# Patient Record
Sex: Male | Born: 1985
Health system: Southern US, Community
[De-identification: ages and names within clinical notes are randomized; demographics above are authoritative.]

## PROBLEM LIST (undated history)

## (undated) DIAGNOSIS — I10 Essential (primary) hypertension: Secondary | ICD-10-CM

---
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
## 2003-11-25 ENCOUNTER — Emergency Department (HOSPITAL_COMMUNITY): Admission: EM | Admit: 2003-11-25 | Discharge: 2003-11-25 | Payer: Self-pay | Admitting: Emergency Medicine

## 2004-04-19 ENCOUNTER — Emergency Department (HOSPITAL_COMMUNITY): Admission: EM | Admit: 2004-04-19 | Discharge: 2004-04-19 | Payer: Self-pay | Admitting: Family Medicine

## 2004-04-25 ENCOUNTER — Emergency Department (HOSPITAL_COMMUNITY): Admission: EM | Admit: 2004-04-25 | Discharge: 2004-04-25 | Payer: Self-pay | Admitting: Family Medicine

## 2004-04-29 ENCOUNTER — Emergency Department (HOSPITAL_COMMUNITY): Admission: EM | Admit: 2004-04-29 | Discharge: 2004-04-29 | Payer: Self-pay | Admitting: Family Medicine

## 2004-11-08 ENCOUNTER — Emergency Department (HOSPITAL_COMMUNITY): Admission: EM | Admit: 2004-11-08 | Discharge: 2004-11-08 | Payer: Self-pay | Admitting: Emergency Medicine

## 2005-02-12 ENCOUNTER — Emergency Department (HOSPITAL_COMMUNITY): Admission: EM | Admit: 2005-02-12 | Discharge: 2005-02-12 | Payer: Self-pay | Admitting: Emergency Medicine

## 2005-05-07 ENCOUNTER — Emergency Department (HOSPITAL_COMMUNITY): Admission: EM | Admit: 2005-05-07 | Discharge: 2005-05-07 | Payer: Self-pay | Admitting: Family Medicine

## 2005-06-11 ENCOUNTER — Emergency Department (HOSPITAL_COMMUNITY): Admission: EM | Admit: 2005-06-11 | Discharge: 2005-06-12 | Payer: Self-pay | Admitting: Emergency Medicine

## 2005-08-12 ENCOUNTER — Emergency Department (HOSPITAL_COMMUNITY): Admission: EM | Admit: 2005-08-12 | Discharge: 2005-08-12 | Payer: Self-pay | Admitting: Family Medicine

## 2005-11-04 ENCOUNTER — Emergency Department (HOSPITAL_COMMUNITY): Admission: EM | Admit: 2005-11-04 | Discharge: 2005-11-04 | Payer: Self-pay | Admitting: Family Medicine

## 2005-11-08 ENCOUNTER — Emergency Department (HOSPITAL_COMMUNITY): Admission: EM | Admit: 2005-11-08 | Discharge: 2005-11-08 | Payer: Self-pay | Admitting: Emergency Medicine

## 2006-02-05 ENCOUNTER — Emergency Department (HOSPITAL_COMMUNITY): Admission: EM | Admit: 2006-02-05 | Discharge: 2006-02-05 | Payer: Self-pay | Admitting: Emergency Medicine

## 2006-02-06 ENCOUNTER — Emergency Department (HOSPITAL_COMMUNITY): Admission: EM | Admit: 2006-02-06 | Discharge: 2006-02-06 | Payer: Self-pay | Admitting: Emergency Medicine

## 2007-10-13 ENCOUNTER — Emergency Department (HOSPITAL_COMMUNITY): Admission: EM | Admit: 2007-10-13 | Discharge: 2007-10-13 | Payer: Self-pay | Admitting: *Deleted

## 2008-07-29 ENCOUNTER — Emergency Department (HOSPITAL_COMMUNITY): Admission: EM | Admit: 2008-07-29 | Discharge: 2008-07-29 | Payer: Self-pay | Admitting: Emergency Medicine

## 2009-06-17 ENCOUNTER — Emergency Department (HOSPITAL_COMMUNITY): Admission: EM | Admit: 2009-06-17 | Discharge: 2009-06-17 | Payer: Self-pay | Admitting: Family Medicine

## 2009-12-28 ENCOUNTER — Emergency Department (HOSPITAL_COMMUNITY): Admission: EM | Admit: 2009-12-28 | Discharge: 2009-12-28 | Payer: Self-pay | Admitting: Emergency Medicine

## 2010-01-26 ENCOUNTER — Emergency Department (HOSPITAL_COMMUNITY): Admission: EM | Admit: 2010-01-26 | Discharge: 2010-01-26 | Payer: Self-pay | Admitting: Emergency Medicine

## 2010-05-13 ENCOUNTER — Emergency Department (HOSPITAL_COMMUNITY): Admission: EM | Admit: 2010-05-13 | Discharge: 2010-05-13 | Payer: Self-pay | Admitting: Family Medicine

## 2010-06-24 ENCOUNTER — Emergency Department (HOSPITAL_COMMUNITY)
Admission: EM | Admit: 2010-06-24 | Discharge: 2010-06-24 | Payer: Self-pay | Source: Home / Self Care | Admitting: Emergency Medicine

## 2010-07-08 ENCOUNTER — Emergency Department (HOSPITAL_COMMUNITY): Admission: EM | Admit: 2010-07-08 | Discharge: 2010-07-08 | Payer: Self-pay | Admitting: Emergency Medicine

## 2010-11-10 ENCOUNTER — Emergency Department (HOSPITAL_COMMUNITY)
Admission: EM | Admit: 2010-11-10 | Discharge: 2010-11-10 | Payer: Self-pay | Source: Home / Self Care | Admitting: Family Medicine

## 2011-01-23 LAB — STREP A DNA PROBE: Group A Strep Probe: NEGATIVE

## 2011-07-23 LAB — INFLUENZA A+B VIRUS AG-DIRECT(RAPID): Inflenza A Ag: POSITIVE — AB

## 2011-10-28 ENCOUNTER — Encounter (HOSPITAL_COMMUNITY): Payer: Self-pay

## 2011-10-28 ENCOUNTER — Emergency Department (INDEPENDENT_AMBULATORY_CARE_PROVIDER_SITE_OTHER)
Admission: EM | Admit: 2011-10-28 | Discharge: 2011-10-28 | Disposition: A | Discharge status: Home / Self Care | Payer: Self-pay | Source: Home / Self Care | Attending: Emergency Medicine | Admitting: Emergency Medicine

## 2011-10-28 DIAGNOSIS — R0981 Nasal congestion: Secondary | ICD-10-CM

## 2011-10-28 DIAGNOSIS — J3489 Other specified disorders of nose and nasal sinuses: Secondary | ICD-10-CM

## 2011-10-28 DIAGNOSIS — J45909 Unspecified asthma, uncomplicated: Secondary | ICD-10-CM

## 2011-10-28 MED ORDER — ALBUTEROL SULFATE HFA 108 (90 BASE) MCG/ACT IN AERS: 1.0000 | INHALATION_SPRAY | Freq: Four times a day (QID) | RESPIRATORY_TRACT | Status: DC | PRN

## 2011-10-28 MED ORDER — FEXOFENADINE-PSEUDOEPHED ER 60-120 MG PO TB12: 1.0000 | ORAL_TABLET | Freq: Two times a day (BID) | ORAL | Status: AC

## 2011-10-28 NOTE — ED Notes (Signed)
C/o headaches, tan nasal secretions, sinus congestion since Sunday.  Denies cough or fever.

## 2011-10-28 NOTE — ED Provider Notes (Signed)
History     CSN: [id number]  Arrival date & time 10/28/11  4098   First MD Initiated Contact with Patient 10/28/11 [phone]      Chief Complaint  Patient presents with  . Sinusitis    (Consider location/radiation/quality/duration/timing/severity/associated sxs/prior treatment) HPI Comments: SINUS CONGESTION AND COLORED PHLEGM, MILD COUGH, SOME WHEEZING FOR 4 DAYS  Patient is a 26 y.o. male presenting with sinusitis. The history is provided by the patient.  Sinusitis  This is a new problem. The current episode started more than 2 days ago. The problem has not changed since onset.There has been no fever. The pain is moderate. Associated symptoms include chills, ear pain, sinus pressure, sore throat, cough and shortness of breath. Pertinent negatives include no swollen glands. He has tried aspirin for the symptoms. The treatment provided no relief.    Past Medical History  Diagnosis Date  . Asthma     History reviewed. No pertinent past surgical history.  No family history on file.  History  Substance Use Topics  . Smoking status: Current Everyday Smoker -- 1.0 packs/day  . Smokeless tobacco: Not on file  . Alcohol Use: No      Review of Systems  Constitutional: Positive for chills. Negative for fever.  HENT: Positive for ear pain, sore throat and sinus pressure.   Respiratory: Positive for cough, shortness of breath and wheezing.     Allergies  Review of patient's allergies indicates no known allergies.  Home Medications   Current Outpatient Rx  Name Route Sig Dispense Refill  . ALBUTEROL IN Inhalation Inhale into the lungs as needed.    . ALBUTEROL SULFATE HFA 108 (90 BASE) MCG/ACT IN AERS Inhalation Inhale 1-2 puffs into the lungs every 6 (six) hours as needed for wheezing. 1 Inhaler 0  . FEXOFENADINE-PSEUDOEPHED ER 60-120 MG PO TB12 Oral Take 1 tablet by mouth every 12 (twelve) hours. 30 tablet 0    BP 126/92  Pulse 70  Temp(Src) 98.2 F (36.8 C) (Oral)   Resp 18  SpO2 99%  Physical Exam  Nursing note and vitals reviewed. Constitutional: He appears well-nourished. No distress.  HENT:  Head: Normocephalic.  Right Ear: Tympanic membrane normal.  Left Ear: Tympanic membrane normal.  Nose: Nose normal.  Mouth/Throat: Uvula is midline and mucous membranes are normal. Posterior oropharyngeal erythema present.  Eyes: Conjunctivae are normal.  Neck: Trachea normal and normal range of motion.  Cardiovascular: Normal rate.   Pulmonary/Chest: Effort normal and breath sounds normal. No respiratory distress. He has no decreased breath sounds. He has no wheezes. He has no rhonchi. He has no rales.  Lymphadenopathy:    He has no cervical adenopathy.  Neurological: He is alert. He has normal strength. No cranial nerve deficit or sensory deficit.  Skin: Skin is warm. No rash noted.    ED Course  Procedures (including critical care time)  Labs Reviewed - No data to display No results found.   1. Sinus congestion   2. Asthma       MDM  URI with mild RAD        Jimmie Molly, MD 10/28/11 1111

## 2014-08-10 ENCOUNTER — Emergency Department (HOSPITAL_COMMUNITY)
Admission: EM | Admit: 2014-08-10 | Discharge: 2014-08-10 | Disposition: A | Discharge status: Home / Self Care | Payer: BC Managed Care – PPO | Source: Home / Self Care | Attending: Family Medicine | Admitting: Family Medicine

## 2014-08-10 ENCOUNTER — Encounter (HOSPITAL_COMMUNITY): Payer: Self-pay | Admitting: Emergency Medicine

## 2014-08-10 DIAGNOSIS — J45901 Unspecified asthma with (acute) exacerbation: Secondary | ICD-10-CM

## 2014-08-10 MED ORDER — PREDNISONE 50 MG PO TABS: 50.0000 mg | ORAL_TABLET | Freq: Every day | ORAL | Status: DC

## 2014-08-10 MED ORDER — ALBUTEROL SULFATE HFA 108 (90 BASE) MCG/ACT IN AERS: 2.0000 | INHALATION_SPRAY | Freq: Four times a day (QID) | RESPIRATORY_TRACT | Status: DC | PRN

## 2014-08-10 MED ORDER — GUAIFENESIN-CODEINE 100-10 MG/5ML PO SOLN: 5.0000 mL | Freq: Every evening | ORAL | Status: DC | PRN

## 2014-08-10 MED ORDER — IPRATROPIUM-ALBUTEROL 0.5-2.5 (3) MG/3ML IN SOLN
RESPIRATORY_TRACT | Status: AC
  Filled 2014-08-10: qty 3

## 2014-08-10 MED ORDER — IPRATROPIUM-ALBUTEROL 0.5-2.5 (3) MG/3ML IN SOLN
3.0000 mL | Freq: Once | RESPIRATORY_TRACT | Status: AC
  Administered 2014-08-10: 3 mL via RESPIRATORY_TRACT

## 2014-08-10 NOTE — ED Provider Notes (Signed)
Derek Lawson is a 28 y.o. male who presents to Urgent Care today for shortness of breath and wheezing. Patient notes a one-week history of wheezing and shortness of breath. He's used his albuterol inhaler and is running out. He notes a productive cough. No chest pain or palpitations. He is a former smoker. His symptoms are consistent with previous episodes of asthma.   Past Medical History  Diagnosis Date  . Asthma    History  Substance Use Topics  . Smoking status: Former Smoker    Types: Cigarettes    Quit date: 08/10/2013  . Smokeless tobacco: Not on file  . Alcohol Use: No   ROS as above Medications: No current facility-administered medications for this encounter.   Current Outpatient Prescriptions  Medication Sig Dispense Refill  . loratadine (CLARITIN) 10 MG tablet Take 10 mg by mouth daily.      . [DISCONTINUED] ALBUTEROL IN Inhale 2 puffs into the lungs 4 (four) times daily as needed.       Marland Kitchen. albuterol (PROVENTIL HFA;VENTOLIN HFA) 108 (90 BASE) MCG/ACT inhaler Inhale 2 puffs into the lungs every 6 (six) hours as needed for wheezing or shortness of breath.  1 Inhaler  2  . guaiFENesin-codeine 100-10 MG/5ML syrup Take 5 mLs by mouth at bedtime as needed for cough.  120 mL  0  . predniSONE (DELTASONE) 50 MG tablet Take 1 tablet (50 mg total) by mouth daily.  5 tablet  0    Exam:  BP 136/89  Pulse 86  Temp(Src) 98 F (36.7 C) (Oral)  Resp 24  SpO2 96% Gen: Well NAD HEENT: EOMI,  MMM Lungs: Normal work of breathing. Coarse breath sounds and prolonged expiratory phase with wheezing bilateral Heart: RRR no MRG Abd: NABS, Soft. Nondistended, Nontender Exts: Brisk capillary refill, warm and well perfused.   Patient was given a 2.5/0.5 mg DuoNeb nebulizer treatment, and felt better his lung exam became normal.   No results found for this or any previous visit (from the past 24 hour(s)). No results found.  Assessment and Plan: 28 y.o. male with asthma exacerbation.  Treat with prednisone albuterol and codeine containing cough medication.  Discussed warning signs or symptoms. Please see discharge instructions. Patient expresses understanding.     Rodolph BongEvan S Dewitt Judice, MD 08/10/14 [phone]

## 2014-08-10 NOTE — ED Notes (Signed)
He had a cold last week.  Started wheezing off and on last week and used his Albuteral inhaler QID-its getting low.  Cough is prod. Of clear sputum.  No chest pain or fever.  C/o throat itching but not sore.

## 2014-08-10 NOTE — Discharge Instructions (Signed)
Thank you for coming in today. Call or go to the emergency room if you get worse, have trouble breathing, have chest pains, or palpitations.  Follow up with a primary doctor.   PRIMARY CARE Merchant navy officerDOCTORS [facility] [address]  [place], [place] [phone]  Fax [phone]  Nature conservation [facility] at [facility] [address]  [place], [place] Ph [phone]  Fax [phone]  Nature conservation officer[facility] at [place] / [place] [id number]  [place], [place] Ph [phone]  Fax [phone]  [facility] [address]  [place], [place] Ph [phone]  Fax [phone]  [facility] [address] [place]  [place], [place] Ph [phone]  Fax [phone]  [facility] [address] [place]  [place], [place] Ph [phone]  Fax [phone]   [facility] @ [place] [address] [place] [address] Phone: [phone]   [facility] [address] [place] [address] Phone: [phone]   [facility] @ [place] [address] [place] [address] Phone: [phone]   [facility] @ [place] [address] [place] [place] [address] Phone: [phone]   [facility] @ [place] [address] [place] [address] Phone: [phone] Fax: [phone]   [facility] @ [place] [address] [place] [address] Phone: [phone]   Dr. Maryelizabeth RowanElizabeth Dewey [address] [place] [address] [phone]   Asthma Asthma is a recurring condition in which the airways tighten and narrow. Asthma can make it difficult to breathe. It can cause coughing, wheezing, and shortness of breath. Asthma episodes, also called asthma  attacks, range from minor to life-threatening. Asthma cannot be cured, but medicines and lifestyle changes can help control it. CAUSES Asthma is believed to be caused by inherited (genetic) and environmental factors, but its exact cause is unknown. Asthma may be triggered by allergens, lung infections, or irritants in the air. Asthma triggers are different for each person. Common triggers include:   Animal dander.  Dust mites.  Cockroaches.  Pollen from trees or grass.  Mold.  Smoke.  Air pollutants such as dust, household cleaners, hair sprays, aerosol sprays, paint fumes, strong chemicals, or strong odors.  Cold air, weather changes, and winds (which increase molds and pollens in the air).  Strong emotional expressions such as crying or laughing hard.  Stress.  Certain medicines (such as aspirin) or types of drugs (such as beta-blockers).  Sulfites in foods and drinks. Foods and drinks that may contain sulfites include dried fruit, potato chips, and sparkling grape juice.  Infections or inflammatory conditions such as the flu, a cold, or an inflammation of the nasal membranes (rhinitis).  Gastroesophageal reflux disease (GERD).  Exercise or strenuous activity. SYMPTOMS Symptoms may occur immediately after asthma is triggered or many hours later. Symptoms include:  Wheezing.  Excessive nighttime or early morning coughing.  Frequent or severe coughing with a common cold.  Chest tightness.  Shortness of breath. DIAGNOSIS  The diagnosis of asthma is made by a review of your medical history and a physical exam. Tests may also be performed. These may include:  Lung function studies. These tests show how much air you breathe in and out.  Allergy tests.  Imaging tests such as X-rays. TREATMENT  Asthma cannot be cured, but it can usually be controlled. Treatment involves identifying and avoiding your asthma triggers. It also involves medicines. There are 2 classes of  medicine used for asthma treatment:   Controller medicines. These prevent asthma symptoms from occurring. They are usually taken every day.  Reliever or rescue medicines. These quickly relieve asthma symptoms. They are used as needed and provide short-term relief. Your health care provider will help you create an asthma action plan. An asthma action plan is a written plan for managing and treating your asthma attacks. It includes a list of your asthma triggers and how they may be avoided. It also includes information on when medicines should be taken and when their dosage should be changed. An action plan may also involve the use of a device called a peak flow meter. A peak flow meter measures how well the lungs are working. It helps you monitor your condition. HOME CARE INSTRUCTIONS   Take medicines only as directed by your health care provider. Speak with your health care provider if you have questions about how or when to take the medicines.  Use a peak flow meter as directed by your health care provider. Record and keep track of readings.  Understand and use the action plan to help minimize or stop an asthma attack without needing to seek medical care.  Control your home environment in the following ways to help prevent asthma attacks:  Do not smoke. Avoid being exposed to secondhand smoke.  Change your heating and air conditioning filter regularly.  Limit your use of fireplaces and wood stoves.  Get rid of pests (such as roaches and mice) and their droppings.  Throw away plants if you see mold on them.  Clean your floors and dust regularly. Use unscented cleaning products.  Try to have someone else vacuum for you regularly. Stay out of rooms while they are being vacuumed and for a short while afterward. If you vacuum, use a dust mask from a hardware store, a double-layered or microfilter vacuum cleaner bag, or a vacuum cleaner with a HEPA filter.  Replace carpet with wood, tile, or  vinyl flooring. Carpet can trap dander and dust.  Use allergy-proof pillows, mattress covers, and box spring covers.  Wash bed sheets and blankets every week in hot water and dry them in a dryer.  Use blankets that are made of polyester or cotton.  Clean bathrooms and kitchens with bleach. If possible, have someone repaint the walls in these rooms with mold-resistant paint. Keep out of the rooms that are being cleaned and painted.  Wash hands frequently. SEEK MEDICAL CARE IF:   You have wheezing, shortness of breath, or a cough even if taking medicine to prevent attacks.  The colored mucus you cough up (sputum) is thicker than usual.  Your sputum changes from clear or white to yellow, green, gray, or bloody.  You have any problems that may be related to the medicines you are taking (such as a rash, itching, swelling, or trouble breathing).  You are using a reliever medicine more than 2-3 times per week.  Your peak flow is still at 50-79% of your personal best after following your action plan for 1 hour.  You have a fever. SEEK IMMEDIATE MEDICAL CARE IF:   You seem to be getting worse and are unresponsive to treatment during an asthma attack.  You are short of breath even at rest.  You  get short of breath when doing very little physical activity.  You have difficulty eating, drinking, or talking due to asthma symptoms.  You develop chest pain.  You develop a fast heartbeat.  You have a bluish color to your lips or fingernails.  You are light-headed, dizzy, or faint.  Your peak flow is less than 50% of your personal best. MAKE SURE YOU:   Understand these instructions.  Will watch your condition.  Will get help right away if you are not doing well or get worse. Document Released: 10/04/2005 Document Revised: 02/18/2014 Document Reviewed: 05/03/2013 [facility] Patient Information 2015 [place], [place]. This information is not intended to replace advice given to you by  your health care provider. Make sure you discuss any questions you have with your health care provider.

## 2015-04-05 ENCOUNTER — Emergency Department (HOSPITAL_COMMUNITY)
Admission: EM | Admit: 2015-04-05 | Discharge: 2015-04-05 | Disposition: A | Discharge status: Home / Self Care | Payer: Self-pay | Attending: Emergency Medicine | Admitting: Emergency Medicine

## 2015-04-05 ENCOUNTER — Encounter (HOSPITAL_COMMUNITY): Payer: Self-pay | Admitting: *Deleted

## 2015-04-05 ENCOUNTER — Emergency Department (HOSPITAL_COMMUNITY): Payer: Self-pay

## 2015-04-05 DIAGNOSIS — J029 Acute pharyngitis, unspecified: Secondary | ICD-10-CM | POA: Insufficient documentation

## 2015-04-05 DIAGNOSIS — Z79899 Other long term (current) drug therapy: Secondary | ICD-10-CM | POA: Insufficient documentation

## 2015-04-05 DIAGNOSIS — R059 Cough, unspecified: Secondary | ICD-10-CM

## 2015-04-05 DIAGNOSIS — R05 Cough: Secondary | ICD-10-CM | POA: Insufficient documentation

## 2015-04-05 DIAGNOSIS — Z87891 Personal history of nicotine dependence: Secondary | ICD-10-CM | POA: Insufficient documentation

## 2015-04-05 DIAGNOSIS — J45909 Unspecified asthma, uncomplicated: Secondary | ICD-10-CM | POA: Insufficient documentation

## 2015-04-05 LAB — RAPID STREP SCREEN (MED CTR MEBANE ONLY): Streptococcus, Group A Screen (Direct): NEGATIVE

## 2015-04-05 MED ORDER — AMOXICILLIN 500 MG PO CAPS: 500.0000 mg | ORAL_CAPSULE | Freq: Three times a day (TID) | ORAL | Status: DC

## 2015-04-05 MED ORDER — ALBUTEROL SULFATE (2.5 MG/3ML) 0.083% IN NEBU
5.0000 mg | INHALATION_SOLUTION | Freq: Once | RESPIRATORY_TRACT | Status: AC
  Administered 2015-04-05: 5 mg via RESPIRATORY_TRACT
  Filled 2015-04-05: qty 6

## 2015-04-05 NOTE — Discharge Instructions (Signed)
Take the prescribed medication as directed. °Rest and drink plenty of fluids. °Return to the ED for new or worsening symptoms. ° °

## 2015-04-05 NOTE — ED Notes (Signed)
Pt c/o sore throat and congestion x 2 days; pt states that he cannot breathe through his nose; pt c/o throat feeling swollen

## 2015-04-05 NOTE — ED Provider Notes (Signed)
CSN: [id number]     Arrival date & time 04/05/15  9774 History   First MD Initiated Contact with Patient 04/05/15 [phone]     Chief Complaint  Patient presents with  . Sore Throat  . Nasal Congestion     (Consider location/radiation/quality/duration/timing/severity/associated sxs/prior Treatment) The history is provided by the patient and medical records.     This is a 29 y.o. F here with 3 days of URI symptoms.  Specifically patient has had sore throat, productive cough with brown sputum, and nasal congestion.  States it is painful to swallow, no difficulty doing so.  Endorses subjective fever and chills.  No chest pain or SOB.  No abdominal pain, nausea, vomiting, or diarrhea.  No known sick contacts.  Patient does have hx of asthma, uses albuterol inhaler only.  Past Medical History  Diagnosis Date  . Asthma    History reviewed. No pertinent past surgical history. Family History  Problem Relation Age of Onset  . Diabetes Father    History  Substance Use Topics  . Smoking status: Former Smoker    Types: Cigarettes    Quit date: 08/10/2013  . Smokeless tobacco: Not on file  . Alcohol Use: Yes     Comment: socially    Review of Systems  HENT: Positive for congestion and sore throat.   Respiratory: Positive for cough.   All other systems reviewed and are negative.     Allergies  Review of patient's allergies indicates no known allergies.  Home Medications   Prior to Admission medications   Medication Sig Start Date End Date Taking? Authorizing Provider  albuterol (PROVENTIL HFA;VENTOLIN HFA) 108 (90 BASE) MCG/ACT inhaler Inhale 2 puffs into the lungs every 6 (six) hours as needed for wheezing or shortness of breath. 08/10/14  Yes Rodolph Bong, MD  guaiFENesin (MUCINEX) 600 MG 12 hr tablet Take 600 mg by mouth 2 (two) times daily as needed for cough.   Yes Historical Provider, MD  ibuprofen (ADVIL,MOTRIN) 200 MG tablet Take 400 mg by mouth every 6 (six) hours as needed  for moderate pain.   Yes Historical Provider, MD  guaiFENesin-codeine 100-10 MG/5ML syrup Take 5 mLs by mouth at bedtime as needed for cough. Patient not taking: Reported on 04/05/2015 08/10/14   Rodolph Bong, MD  loratadine (CLARITIN) 10 MG tablet Take 10 mg by mouth daily.    Historical Provider, MD  predniSONE (DELTASONE) 50 MG tablet Take 1 tablet (50 mg total) by mouth daily. Patient not taking: Reported on 04/05/2015 08/10/14   Rodolph Bong, MD   BP 142/89 mmHg  Pulse 104  Temp(Src) 97.8 F (36.6 C) (Oral)  Resp 20  SpO2 95%   Physical Exam  Constitutional: He is oriented to person, place, and time. He appears well-developed and well-nourished. No distress.  HENT:  Head: Normocephalic and atraumatic.  Right Ear: Tympanic membrane and ear canal normal.  Left Ear: Tympanic membrane and ear canal normal.  Nose: Mucosal edema present.  Mouth/Throat: Uvula is midline and mucous membranes are normal. No oral lesions. No trismus in the jaw. Posterior oropharyngeal erythema present. No oropharyngeal exudate, posterior oropharyngeal edema or tonsillar abscesses.  Tonsils 2+ bilaterally without exudate; uvula midline without peritonsillar abscess; handling secretions appropriately; no difficulty swallowing or speaking  Eyes: Conjunctivae and EOM are normal. Pupils are equal, round, and reactive to light.  Neck: Normal range of motion. Neck supple.  Cardiovascular: Normal rate, regular rhythm and normal heart sounds.   Pulmonary/Chest: Effort  normal and breath sounds normal. No respiratory distress. He has no wheezes.  Abdominal: Soft. Bowel sounds are normal. There is no tenderness. There is no guarding.  Musculoskeletal: Normal range of motion.  Neurological: He is alert and oriented to person, place, and time.  Skin: Skin is warm. He is not diaphoretic.  Psychiatric: He has a normal mood and affect.  Nursing note and vitals reviewed.   ED Course  Procedures (including critical care  time) Labs Review Labs Reviewed  RAPID STREP SCREEN (NOT AT [facility])    Imaging Review Dg Chest 2 View  04/05/2015   CLINICAL DATA:  Cough.  EXAM: CHEST  2 VIEW  COMPARISON:  October 13, 2007.  FINDINGS: The heart size and mediastinal contours are within normal limits. Both lungs are clear. No pneumothorax or pleural effusion is noted. The visualized skeletal structures are unremarkable.  IMPRESSION: No active cardiopulmonary disease.   Electronically Signed   By: Lupita Raider, M.D.   On: 04/05/2015 07:04     EKG Interpretation None      MDM   Final diagnoses:  Cough  Sore throat   29 y.o. M with URI symptoms x3 days.  Patient afebrile, non-toxic.  Tonsils are enlarged bilaterally without exudates, handling secretions well.  No signs of peritonsillar abscess.  Slight expiratory wheezes noted, no distress.  VSS on RA.  CXR without acute findings.  Lung sounds cleared after albuterol neb.  Rapid strep was obtained however have had difficulties with processing.  Given his tonsillar enlargement will start on abx for strep.  Patient encouraged to rest, drink fluids.  Use albuterol inhaler PRN.  Discussed plan with patient, he/she acknowledged understanding and agreed with plan of care.  Return precautions given for new or worsening symptoms.  Garlon Hatchet, PA-C 04/05/15 1610  Blake Divine, MD 04/05/15 2308

## 2015-04-07 LAB — CULTURE, GROUP A STREP: STREP A CULTURE: NEGATIVE

## 2015-09-08 ENCOUNTER — Encounter (HOSPITAL_COMMUNITY): Payer: Self-pay | Admitting: Emergency Medicine

## 2015-09-08 ENCOUNTER — Emergency Department (HOSPITAL_COMMUNITY)
Admission: EM | Admit: 2015-09-08 | Discharge: 2015-09-08 | Disposition: A | Discharge status: Home / Self Care | Payer: Self-pay | Attending: Physician Assistant | Admitting: Physician Assistant

## 2015-09-08 DIAGNOSIS — Z792 Long term (current) use of antibiotics: Secondary | ICD-10-CM | POA: Insufficient documentation

## 2015-09-08 DIAGNOSIS — J45909 Unspecified asthma, uncomplicated: Secondary | ICD-10-CM | POA: Insufficient documentation

## 2015-09-08 DIAGNOSIS — M546 Pain in thoracic spine: Secondary | ICD-10-CM | POA: Insufficient documentation

## 2015-09-08 DIAGNOSIS — Z87891 Personal history of nicotine dependence: Secondary | ICD-10-CM | POA: Insufficient documentation

## 2015-09-08 DIAGNOSIS — Z79899 Other long term (current) drug therapy: Secondary | ICD-10-CM | POA: Insufficient documentation

## 2015-09-08 LAB — URINALYSIS, ROUTINE W REFLEX MICROSCOPIC
BILIRUBIN URINE: NEGATIVE
GLUCOSE, UA: NEGATIVE mg/dL
HGB URINE DIPSTICK: NEGATIVE
Ketones, ur: NEGATIVE mg/dL
Leukocytes, UA: NEGATIVE
Nitrite: NEGATIVE
PH: 6 (ref 5.0–8.0)
Protein, ur: NEGATIVE mg/dL
SPECIFIC GRAVITY, URINE: 1.028 (ref 1.005–1.030)

## 2015-09-08 MED ORDER — ACETAMINOPHEN 325 MG PO TABS
650.0000 mg | ORAL_TABLET | Freq: Once | ORAL | Status: AC
  Administered 2015-09-08: 650 mg via ORAL
  Filled 2015-09-08: qty 2

## 2015-09-08 MED ORDER — IBUPROFEN 200 MG PO TABS
400.0000 mg | ORAL_TABLET | Freq: Once | ORAL | Status: AC
  Administered 2015-09-08: 400 mg via ORAL
  Filled 2015-09-08: qty 2

## 2015-09-08 MED ORDER — IBUPROFEN 800 MG PO TABS: 800.0000 mg | ORAL_TABLET | Freq: Three times a day (TID) | ORAL | Status: DC

## 2015-09-08 NOTE — ED Notes (Signed)
Patient here with complaints of back pain. Reports that he woke up with pain, denies injury.

## 2015-09-08 NOTE — ED Provider Notes (Signed)
CSN: [id number]     Arrival date & time 09/08/15  1002 History   First MD Initiated Contact with Patient 09/08/15 1022     Chief Complaint  Patient presents with  . Back Pain     (Consider location/radiation/quality/duration/timing/severity/associated sxs/prior Treatment) HPI  Patient presents with left sided back pain that began this morning while doing yardwork Constant, sharp, poking, 8/10 Worse with movement 400 mg ibuprofen PTA Ambulator without difficulty Denies bowel or bladder incontinence, saddle anesthesia, IVDU, trauma, hx of cancer Denies urinary symptoms, abdominal pain, fevers, SOB, numbness, tingling, or weakness.  Past Medical History  Diagnosis Date  . Asthma    History reviewed. No pertinent past surgical history. Family History  Problem Relation Age of Onset  . Diabetes Father    Social History  Substance Use Topics  . Smoking status: Former Smoker    Types: Cigarettes    Quit date: 08/10/2013  . Smokeless tobacco: None  . Alcohol Use: Yes     Comment: socially    Review of Systems  All other systems negative unless otherwise stated in HPI   Allergies  Review of patient's allergies indicates no known allergies.  Home Medications   Prior to Admission medications   Medication Sig Start Date End Date Taking? Authorizing Provider  albuterol (PROVENTIL HFA;VENTOLIN HFA) 108 (90 BASE) MCG/ACT inhaler Inhale 2 puffs into the lungs every 6 (six) hours as needed for wheezing or shortness of breath. 08/10/14   Rodolph BongEvan S Corey, MD  amoxicillin (AMOXIL) 500 MG capsule Take 1 capsule (500 mg total) by mouth 3 (three) times daily. 04/05/15   Garlon HatchetLisa M Sanders, PA-C  guaiFENesin (MUCINEX) 600 MG 12 hr tablet Take 600 mg by mouth 2 (two) times daily as needed for cough.    Historical Provider, MD  guaiFENesin-codeine 100-10 MG/5ML syrup Take 5 mLs by mouth at bedtime as needed for cough. Patient not taking: Reported on 04/05/2015 08/10/14   Rodolph BongEvan S Corey, MD    ibuprofen (ADVIL,MOTRIN) 800 MG tablet Take 1 tablet (800 mg total) by mouth 3 (three) times daily. 09/08/15   Cheri FowlerKayla Misaki Sozio, PA-C  loratadine (CLARITIN) 10 MG tablet Take 10 mg by mouth daily.    Historical Provider, MD  predniSONE (DELTASONE) 50 MG tablet Take 1 tablet (50 mg total) by mouth daily. Patient not taking: Reported on 04/05/2015 08/10/14   Rodolph BongEvan S Corey, MD   BP 148/96 mmHg  Pulse 86  Temp(Src) 98.5 F (36.9 C) (Oral)  Resp 15  SpO2 97% Physical Exam  Constitutional: He is oriented to person, place, and time. He appears well-developed and well-nourished.  HENT:  Head: Atraumatic.  Eyes: Conjunctivae are normal.  Cardiovascular: Normal rate, regular rhythm, normal heart sounds and intact distal pulses.   Pulses:      Posterior tibial pulses are 2+ on the right side, and 2+ on the left side.  Pulmonary/Chest: Effort normal and breath sounds normal.  Abdominal: Soft. Bowel sounds are normal. He exhibits no distension. There is no tenderness. There is no CVA tenderness.  Musculoskeletal:       Back:  No spinous process tenderness.  No step offs.  Neurological: He is alert and oriented to person, place, and time.  No saddle anesthesia.  Strength and sensation intact bilaterally in lower extremities.  Gait normal.   Skin: Skin is warm and dry.  Psychiatric: He has a normal mood and affect. His behavior is normal.    ED Course  Procedures (including critical care time) Labs  Review Labs Reviewed  URINALYSIS, ROUTINE W REFLEX MICROSCOPIC (NOT AT [facility])    Imaging Review No results found. I have personally reviewed and evaluated these images and lab results as part of my medical decision-making.   EKG Interpretation None      MDM   Final diagnoses:  Left-sided thoracic back pain    Patient presents with back pain.  VSS.  No trauma.  No fevers, SOB, urinary symptoms.  No indication for imaging.  No red flags.  No neurological deficits.  Distal pulses intact.  No  gait abnormalities.  Suspect back strain or other mechanical cause.  Doubt cauda equina.  Doubt infectious process.  Doubt AAA.  Discussed return precautions to the ED. Follow up with PCP.     Cheri Fowler, PA-C 09/08/15 1208  Courteney Lyn Mackuen, MD 09/09/15 0700

## 2015-09-08 NOTE — Discharge Instructions (Signed)

## 2016-04-09 ENCOUNTER — Emergency Department (HOSPITAL_COMMUNITY)
Admission: EM | Admit: 2016-04-09 | Discharge: 2016-04-09 | Disposition: A | Discharge status: Home / Self Care | Payer: Self-pay | Attending: Emergency Medicine | Admitting: Emergency Medicine

## 2016-04-09 ENCOUNTER — Encounter (HOSPITAL_COMMUNITY): Payer: Self-pay | Admitting: *Deleted

## 2016-04-09 DIAGNOSIS — Z87891 Personal history of nicotine dependence: Secondary | ICD-10-CM | POA: Insufficient documentation

## 2016-04-09 DIAGNOSIS — J45909 Unspecified asthma, uncomplicated: Secondary | ICD-10-CM | POA: Insufficient documentation

## 2016-04-09 DIAGNOSIS — R030 Elevated blood-pressure reading, without diagnosis of hypertension: Secondary | ICD-10-CM | POA: Insufficient documentation

## 2016-04-09 NOTE — Care Management Note (Signed)
Case Management Note  Patient Details  Name: Derek Lawson MRN: [id number] Date of Birth: 1986/06/26  Subjective/Objective:                  30 y.o. male who presents to the Emergency Department complaining of HTN./ From home alone.  Action/Plan: Follow for disposition needs./ Set up follow-up appointment   Expected Discharge Date:      04/09/16            Expected Discharge Plan:  Home/Self Care  In-House Referral:  NA  Discharge planning Services  CM Consult, Follow-up appt scheduled  Post Acute Care Choice:  NA Choice offered to:  NA  DME Arranged:  N/A DME Agency:  NA  HH Arranged:  NA HH Agency:  NA  Status of Service:  Completed, signed off  If discussed at [place] of Stay Meetings, dates discussed:    Additional Comments: [name] J. Clydene Laming, RN, BSN, [place] [phone] ER CM consulted regarding PCP establishment and insurance enrollment. Pt presented to [facility] ER today with HTN. NCM met with pt at bedside; pt confirms not having access to f/u care with PCP or insurance coverage. Discussed with patient importance and benefits of establishing PCP, and not utilizing the ER for primary care needs. Pt verbalized understanding and is in agreement.  NCM advised that Internal Medicine providers at [facility] are accepting new pts that are uninsured. Pt verbalized understanding and asked to have appointment arranged. NCM set up appointment at [facility] with [name], [place] on 6/28 at 1000.  NCM informed pt they may visit [place] and [place] for Rx needs upon discharge.  NCM provided business card of [facility], [place] [facility] and instructed to call with question or concerns pertaining [facility]/orange card process.  Pt verbalized understanding.  [name], RN 04/09/2016, 10:33 AM

## 2016-04-09 NOTE — ED Notes (Signed)
Pt reports he was seen for a physical for a job & was sent here by an UC for eval for HTN, pt denies blurred vision & HA, pt asymptomatic at this time, A&O x4, BP 144/102

## 2016-04-09 NOTE — Discharge Instructions (Signed)
DASH Eating Plan °DASH stands for "Dietary Approaches to Stop Hypertension." The DASH eating plan is a healthy eating plan that has been shown to reduce high blood pressure (hypertension). Additional health benefits may include reducing the risk of type 2 diabetes mellitus, heart disease, and stroke. The DASH eating plan may also help with weight loss. °WHAT DO I NEED TO KNOW ABOUT THE DASH EATING PLAN? °For the DASH eating plan, you will follow these general guidelines: °· Choose foods with a percent daily value for sodium of less than 5% (as listed on the food label). °· Use salt-free seasonings or herbs instead of table salt or sea salt. °· Check with your health care provider or pharmacist before using salt substitutes. °· Eat lower-sodium products, often labeled as "lower sodium" or "no salt added." °· Eat fresh foods. °· Eat more vegetables, fruits, and low-fat dairy products. °· Choose whole grains. Look for the word "whole" as the first word in the ingredient list. °· Choose fish and skinless chicken or turkey more often than red meat. Limit fish, poultry, and meat to 6 oz (170 g) each day. °· Limit sweets, desserts, sugars, and sugary drinks. °· Choose heart-healthy fats. °· Limit cheese to 1 oz (28 g) per day. °· Eat more home-cooked food and less restaurant, buffet, and fast food. °· Limit fried foods. °· Cook foods using methods other than frying. °· Limit canned vegetables. If you do use them, rinse them well to decrease the sodium. °· When eating at a restaurant, ask that your food be prepared with less salt, or no salt if possible. °WHAT FOODS CAN I EAT? °Seek help from a dietitian for individual calorie needs. °Grains °Whole grain or whole wheat bread. Brown rice. Whole grain or whole wheat pasta. Quinoa, bulgur, and whole grain cereals. Low-sodium cereals. Corn or whole wheat flour tortillas. Whole grain cornbread. Whole grain crackers. Low-sodium crackers. °Vegetables °Fresh or frozen vegetables  (raw, steamed, roasted, or grilled). Low-sodium or reduced-sodium tomato and vegetable juices. Low-sodium or reduced-sodium tomato sauce and paste. Low-sodium or reduced-sodium canned vegetables.  °Fruits °All fresh, canned (in natural juice), or frozen fruits. °Meat and Other Protein Products °Ground beef (85% or leaner), grass-fed beef, or beef trimmed of fat. Skinless chicken or turkey. Ground chicken or turkey. Pork trimmed of fat. All fish and seafood. Eggs. Dried beans, peas, or lentils. Unsalted nuts and seeds. Unsalted canned beans. °Dairy °Low-fat dairy products, such as skim or 1% milk, 2% or reduced-fat cheeses, low-fat ricotta or cottage cheese, or plain low-fat yogurt. Low-sodium or reduced-sodium cheeses. °Fats and Oils °Tub margarines without trans fats. Light or reduced-fat mayonnaise and salad dressings (reduced sodium). Avocado. Safflower, olive, or canola oils. Natural peanut or almond butter. °Other °Unsalted popcorn and pretzels. °The items listed above may not be a complete list of recommended foods or beverages. Contact your dietitian for more options. °WHAT FOODS ARE NOT RECOMMENDED? °Grains °White bread. White pasta. White rice. Refined cornbread. Bagels and croissants. Crackers that contain trans fat. °Vegetables °Creamed or fried vegetables. Vegetables in a cheese sauce. Regular canned vegetables. Regular canned tomato sauce and paste. Regular tomato and vegetable juices. °Fruits °Dried fruits. Canned fruit in light or heavy syrup. Fruit juice. °Meat and Other Protein Products °Fatty cuts of meat. Ribs, chicken wings, bacon, sausage, bologna, salami, chitterlings, fatback, hot dogs, bratwurst, and packaged luncheon meats. Salted nuts and seeds. Canned beans with salt. °Dairy °Whole or 2% milk, cream, half-and-half, and cream cheese. Whole-fat or sweetened yogurt. Full-fat   cheeses or blue cheese. Nondairy creamers and whipped toppings. Processed cheese, cheese spreads, or cheese  curds. Condiments Onion and garlic salt, seasoned salt, table salt, and sea salt. Canned and packaged gravies. Worcestershire sauce. Tartar sauce. Barbecue sauce. Teriyaki sauce. Soy sauce, including reduced sodium. Steak sauce. Fish sauce. Oyster sauce. Cocktail sauce. Horseradish. Ketchup and mustard. Meat flavorings and tenderizers. Bouillon cubes. Hot sauce. Tabasco sauce. Marinades. Taco seasonings. Relishes. Fats and Oils Butter, stick margarine, lard, shortening, ghee, and bacon fat. Coconut, palm kernel, or palm oils. Regular salad dressings. Other Pickles and olives. Salted popcorn and pretzels. The items listed above may not be a complete list of foods and beverages to avoid. Contact your dietitian for more information. WHERE CAN I FIND MORE INFORMATION? National Heart, Lung, and Blood Institute: CablePromo.itwww.nhlbi.nih.gov/health/health-topics/topics/dash/   This information is not intended to replace advice given to you by your health care provider. Make sure you discuss any questions you have with your health care provider.   Document Released: 09/23/2011 Document Revised: 10/25/2014 Document Reviewed: 08/08/2013 Elsevier Interactive Patient Education 2016 Elsevier Inc.  Blood Pressure Record Sheet Your blood pressure on this visit to the emergency department or clinic is elevated. This does not necessarily mean you have high blood pressure (hypertension), but it does mean that your blood pressure needs to be rechecked. Many times your blood pressure can increase due to illness, pain, anxiety, or other factors. We recommend that you get a series of blood pressure readings done over a period of 5 days. It is best to get a reading in the morning and one in the evening. You should make sure to sit and relax for 1-5 minutes before the reading is taken. Write the readings down and make a follow-up appointment with your health care provider to discuss the results. If there is not a free clinic or a  drug store with a blood-pressure-taking machine near you, you can purchase blood-pressure-taking equipment from a drug store. Having one in the home allows you the convenience of taking your blood pressure while you are home and relaxed.  Your blood pressure in the emergency department or clinic on ________ was ____________________. BLOOD PRESSURE LOG Date: _______________________  a.m. _____________________  p.m. _____________________ Date: _______________________  a.m. _____________________  p.m. _____________________ Date: _______________________  a.m. _____________________  p.m. _____________________ Date: _______________________  a.m. _____________________  p.m. _____________________ Date: _______________________  a.m. _____________________  p.m. _____________________   This information is not intended to replace advice given to you by your health care provider. Make sure you discuss any questions you have with your health care provider.   Document Released: 07/03/2003 Document Revised: 10/25/2014 Document Reviewed: 11/27/2013 Elsevier Interactive Patient Education [employer]2016 Elsevier Inc.

## 2016-04-09 NOTE — ED Provider Notes (Signed)
CSN: [id number]     Arrival date & time 04/09/16  0932 History   First MD Initiated Contact with Patient 04/09/16 1005     Chief Complaint  Patient presents with  . Hypertension     Patient is a 30 y.o. male presenting with hypertension. The history is provided by the patient. No language interpreter was used.  Hypertension   Derek Lawson is a 30 y.o. male who presents to the Emergency Department complaining of HTN.  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 He was told that his blood pressure was elevated and he needed be seen by a doctor. He does not have the exact number with him but thinks it was in the 140s. He denies any headache, chest pain, vision changes, difficult breathing, numbness, weakness. He is a smoker. No alcohol or drug use. He is a family history of hypertension.  Past Medical History  Diagnosis Date  . Asthma    History reviewed. No pertinent past surgical history. Family History  Problem Relation Age of Onset  . Diabetes Father    Social History  Substance Use Topics  . Smoking status: Former Smoker -- .5 years    Types: Cigarettes    Quit date: 08/10/2013  . Smokeless tobacco: None  . Alcohol Use: Yes     Comment: socially    Review of Systems  All other systems reviewed and are negative.     Allergies  Review of patient's allergies indicates no known allergies.  Home Medications   Prior to Admission medications   Medication Sig Start Date End Date Taking? Authorizing Provider  diphenhydrAMINE (BENADRYL) 25 mg capsule Take 25 mg by mouth every 6 (six) hours as needed for allergies.   Yes Historical Provider, MD  guaiFENesin (MUCINEX) 600 MG 12 hr tablet Take 600 mg by mouth 2 (two) times daily as needed for cough.   Yes Historical Provider, MD  ibuprofen (ADVIL,MOTRIN) 800 MG tablet Take 1 tablet (800 mg total) by mouth 3 (three) times daily. 09/08/15  Yes Cheri FowlerKayla Rose, PA-C  albuterol (PROVENTIL HFA;VENTOLIN  HFA) 108 (90 BASE) MCG/ACT inhaler Inhale 2 puffs into the lungs every 6 (six) hours as needed for wheezing or shortness of breath. Patient not taking: Reported on 04/09/2016 08/10/14   Rodolph BongEvan S Corey, MD   BP 138/96 mmHg  Pulse 75  Temp(Src) 97.7 F (36.5 C) (Oral)  Resp 18  SpO2 95% Physical Exam  Constitutional: He is oriented to person, place, and time. He appears well-developed and well-nourished.  HENT:  Head: Normocephalic and atraumatic.  Cardiovascular: Normal rate and regular rhythm.   No murmur heard. Pulmonary/Chest: Effort normal and breath sounds normal. No respiratory distress.  Abdominal: Soft. There is no tenderness. There is no rebound and no guarding.  Musculoskeletal: He exhibits no edema or tenderness.  Neurological: He is alert and oriented to person, place, and time.  Skin: Skin is warm and dry.  Psychiatric: He has a normal mood and affect. His behavior is normal.  Nursing note and vitals reviewed.   ED Course  Procedures (including critical care time) Labs Review Labs Reviewed - No data to display  Imaging Review No results found. I have personally reviewed and evaluated these images and lab results as part of my medical decision-making.   EKG Interpretation None      MDM   Final diagnoses:  Elevated blood pressure reading without diagnosis of hypertension   Patient here for evaluation of asymptomatic hypertension.  He has no complaints in the emergency department. Blood pressure is minimally elevated in the department. Counseled patient on findings of elevated blood pressure without diagnosis of hypertension. Discussed dietary changes and smoking cessation. Discussed importance of establishing a PCP for outpatient follow-up. Home care and return precautions discussed.    Tilden FossaElizabeth Kaedin Hicklin, MD 04/09/16 1539

## 2016-04-14 ENCOUNTER — Ambulatory Visit: Payer: Self-pay | Admitting: Family Medicine

## 2016-04-15 ENCOUNTER — Ambulatory Visit (INDEPENDENT_AMBULATORY_CARE_PROVIDER_SITE_OTHER): Payer: Self-pay | Admitting: Family Medicine

## 2016-04-15 ENCOUNTER — Encounter: Payer: Self-pay | Admitting: Family Medicine

## 2016-04-15 VITALS — BP 152/102 | HR 108 | Temp 97.8°F | Resp 18 | Ht 69.0 in | Wt 229.0 lb

## 2016-04-15 DIAGNOSIS — F17200 Nicotine dependence, unspecified, uncomplicated: Secondary | ICD-10-CM | POA: Insufficient documentation

## 2016-04-15 DIAGNOSIS — Z6833 Body mass index (BMI) 33.0-33.9, adult: Secondary | ICD-10-CM

## 2016-04-15 DIAGNOSIS — R809 Proteinuria, unspecified: Secondary | ICD-10-CM

## 2016-04-15 DIAGNOSIS — J4520 Mild intermittent asthma, uncomplicated: Secondary | ICD-10-CM

## 2016-04-15 DIAGNOSIS — I10 Essential (primary) hypertension: Secondary | ICD-10-CM | POA: Insufficient documentation

## 2016-04-15 DIAGNOSIS — Z23 Encounter for immunization: Secondary | ICD-10-CM

## 2016-04-15 DIAGNOSIS — E669 Obesity, unspecified: Secondary | ICD-10-CM | POA: Insufficient documentation

## 2016-04-15 LAB — COMPLETE METABOLIC PANEL WITH GFR
ALBUMIN: 5 g/dL (ref 3.6–5.1)
ALT: 66 U/L — ABNORMAL HIGH (ref 9–46)
AST: 29 U/L (ref 10–40)
Alkaline Phosphatase: 67 U/L (ref 40–115)
BILIRUBIN TOTAL: 0.7 mg/dL (ref 0.2–1.2)
BUN: 18 mg/dL (ref 7–25)
CO2: 22 mmol/L (ref 20–31)
CREATININE: 1.2 mg/dL (ref 0.60–1.35)
Calcium: 10.3 mg/dL (ref 8.6–10.3)
Chloride: 102 mmol/L (ref 98–110)
GFR, EST NON AFRICAN AMERICAN: 80 mL/min (ref >=60)
GLUCOSE: 82 mg/dL (ref 65–99)
POTASSIUM: 4.7 mmol/L (ref 3.5–5.3)
SODIUM: 138 mmol/L (ref 135–146)
TOTAL PROTEIN: 8 g/dL (ref 6.1–8.1)

## 2016-04-15 LAB — TSH: TSH: 1.89 m[IU]/L (ref 0.40–4.50)

## 2016-04-15 LAB — CBC WITH DIFFERENTIAL/PLATELET
BASOS ABS: 0 {cells}/uL (ref 0–200)
Basophils Relative: 0 %
EOS ABS: 192 {cells}/uL (ref 15–500)
Eosinophils Relative: 3 %
HCT: 47.1 % (ref 38.5–50.0)
Hemoglobin: 16.4 g/dL (ref 13.2–17.1)
LYMPHS PCT: 52 %
Lymphs Abs: 3328 cells/uL (ref 850–3900)
MCH: 29 pg (ref 27.0–33.0)
MCHC: 34.8 g/dL (ref 32.0–36.0)
MCV: 83.4 fL (ref 80.0–100.0)
MONOS PCT: 7 %
MPV: 11.6 fL (ref 7.5–12.5)
Monocytes Absolute: 448 cells/uL (ref 200–950)
Neutro Abs: 2432 cells/uL (ref 1500–7800)
Neutrophils Relative %: 38 %
PLATELETS: 290 10*3/uL (ref 140–400)
RBC: 5.65 MIL/uL (ref 4.20–5.80)
RDW: 14 % (ref 11.0–15.0)
WBC: 6.4 10*3/uL (ref 3.8–10.8)

## 2016-04-15 LAB — LIPID PANEL
CHOL/HDL RATIO: 6.3 ratio — AB (ref <=5.0)
Cholesterol: 222 mg/dL — ABNORMAL HIGH (ref 125–200)
HDL: 35 mg/dL — ABNORMAL LOW (ref >=40)
LDL Cholesterol: 127 mg/dL (ref <130)
Triglycerides: 301 mg/dL — ABNORMAL HIGH (ref <150)
VLDL: 60 mg/dL — ABNORMAL HIGH (ref <30)

## 2016-04-15 LAB — POCT URINALYSIS DIP (DEVICE)
BILIRUBIN URINE: NEGATIVE
GLUCOSE, UA: NEGATIVE mg/dL
Hgb urine dipstick: NEGATIVE
KETONES UR: NEGATIVE mg/dL
Leukocytes, UA: NEGATIVE
Nitrite: NEGATIVE
PROTEIN: 100 mg/dL — AB
Specific Gravity, Urine: >=1.03 (ref 1.005–1.030)
Urobilinogen, UA: 0.2 mg/dL (ref 0.0–1.0)
pH: 5.5 (ref 5.0–8.0)

## 2016-04-15 MED ORDER — MONTELUKAST SODIUM 10 MG PO TABS: 10.0000 mg | ORAL_TABLET | Freq: Every day | ORAL | Status: DC

## 2016-04-15 MED ORDER — LISINOPRIL 10 MG PO TABS: 10.0000 mg | ORAL_TABLET | Freq: Every day | ORAL | Status: DC

## 2016-04-15 MED FILL — ?LISINOPRIL 10 MG TABLET: 10 | 30 days supply | Qty: 30 | Fill #0

## 2016-04-15 MED FILL — MONTELUKAST SOD 10 MG TAB: 10 | 30 days supply | Qty: 30 | Fill #0

## 2016-04-15 NOTE — Progress Notes (Signed)
Subjective:    Patient ID: Derek Lawson, male    DOB: [birth year], 30 y.o.   MRN: [id number]  HPI Mr. Derek Lawson, a 30 year old male presents to establish care. He says that he has not had a primary provider. He has mostly been using urgent care or the emergency department for all primary care needs. 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 Patient was sent to the emergency department for further evaluation.He is not exercising and is not adherent to low salt diet.  He does not check blood pressures at home. Patient denies chest pain, dyspnea, fatigue, irregular heart beat, orthopnea, palpitations, syncope and tachypnea.  Cardiovascular risk factors include: hypertension, male gender, obesity (BMI >= 30 kg/m2), sedentary lifestyle and smoking/ tobacco exposure. He says that he smokes 0.5 packs of cigarettes per day. He is not interested in quitting at this time.    Mr. Derek Lawson also has a history of asthma. He says that it has been greater than 2 years since last asthma exacerbation. He states that asthma symptoms are typically triggered by environmental factors. He takes Benadryl nightly for allergy symptoms.   Symptoms have been controlled.  Medications used in the past to treat these symptoms include beta agonist inhalers and over the counter medications. Suspected precipitants include animal dander, dust and pollens.Patient has not required Emergency Room treatment for asthma symptoms. Also, the patient has not been intubated in the past.  Past Medical History  Diagnosis Date  . Asthma    Immunization History  Administered Date(s) Administered  . Pneumococcal Polysaccharide-23 04/15/2016   Social History   Social History  . Marital Status: Single    Spouse Name: N/A  . Number of Children: N/A  . Years of Education: N/A   Occupational History  . Not on file.   Social History Main Topics   . Smoking status: Current Every Day Smoker -- 0.50 packs/day for .5 years    Types: Cigarettes    Last Attempt to Quit: 08/10/2013  . Smokeless tobacco: Not on file  . Alcohol Use: Yes     Comment: socially  . Drug Use: No  . Sexual Activity: Not on file   Other Topics Concern  . Not on file   Social History Narrative   Review of Systems  Constitutional: Negative.   HENT: Negative.   Eyes: Negative.   Respiratory: Negative.   Cardiovascular: Negative.  Negative for chest pain, palpitations and leg swelling.  Gastrointestinal: Negative.   Endocrine: Negative for cold intolerance, heat intolerance, polydipsia, polyphagia and polyuria.  Genitourinary: Negative.   Musculoskeletal: Negative.   Skin: Negative.   Allergic/Immunologic: Positive for environmental allergies.  Neurological: Negative.   Hematological: Negative.   Psychiatric/Behavioral: Negative.        Objective:   Physical Exam  Constitutional: He is oriented to person, place, and time. He appears well-developed and well-nourished.  HENT:  Head: Normocephalic and atraumatic.  Right Ear: External ear normal.  Left Ear: External ear normal.  Nose: Nose normal.  Mouth/Throat: Oropharynx is clear and moist.  Eyes: Conjunctivae and EOM are normal. Pupils are equal, round, and reactive to light.  Neck: Normal range of motion. Neck supple.  Cardiovascular: Normal rate, regular rhythm, normal heart sounds and intact distal pulses.   Pulmonary/Chest: Effort normal and breath sounds normal.  Abdominal: Soft. Bowel sounds are normal.  Musculoskeletal: Normal range of motion.  Neurological: He is alert and  oriented to person, place, and time. He has normal reflexes.  Skin: Skin is warm and dry.  Psychiatric: He has a normal mood and affect. His behavior is normal. Judgment and thought content normal.     BP 152/102 mmHg  Pulse 108  Temp(Src) 97.8 F (36.6 C) (Oral)  Resp 18  Ht 5\' 9"  (1.753 m)  Wt 229 lb (103.874  kg)  BMI 33.80 kg/m2  SpO2 100% Assessment & Plan:  1. Essential hypertension Blood pressure is above goal. Will start Lisinopril 10 mg daily. The patient is asked to make an attempt to improve diet and exercise patterns to aid in medical management of this problem. - Urinalysis Dipstick - COMPLETE METABOLIC PANEL WITH GFR - POCT urinalysis dip (device) - lisinopril (PRINIVIL,ZESTRIL) 10 MG tablet; Take 1 tablet (10 mg total) by mouth daily.  Dispense: 30 tablet; Refill: 2  2. Proteinuria Will start Lisinopril 10 mg daily.  - POCT urinalysis dip (device)  3. Asthma, mild intermittent, uncomplicated - montelukast (SINGULAIR) 10 MG tablet; Take 1 tablet (10 mg total) by mouth at bedtime.  Dispense: 30 tablet; Refill: 11  4. Obesity Recommend a lowfat, low carbohydrate diet divided over 5-6 small meals, increase water intake to 6-8 glasses, and 150 minutes per week of cardiovascular exercise.   - Urinalysis Dipstick - COMPLETE METABOLIC PANEL WITH GFR - CBC with Differential - HgB A1c - Lipid Panel - TSH  5. BMI 33.0-33.9,adult  - Urinalysis Dipstick - HgB A1c - Lipid Panel - TSH  6. Tobacco dependence Smoking cessation instruction/counseling given:  counseled patient on the dangers of tobacco use, advised patient to stop smoking, and reviewed strategies to maximize success  7. Immunization due  - Pneumococcal polysaccharide vaccine 23-valent greater than or equal to 2yo subcutaneous/IM    RTC: 1 month for hypertension   Cecely Rengel M, FNP

## 2016-04-15 NOTE — Patient Instructions (Addendum)
DASH Eating Plan DASH stands for "Dietary Approaches to Stop Hypertension." The DASH eating plan is a healthy eating plan that has been shown to reduce high blood pressure (hypertension). Additional health benefits may include reducing the risk of type 2 diabetes mellitus, heart disease, and stroke. The DASH eating plan may also help with weight loss. WHAT DO I NEED TO KNOW ABOUT THE DASH EATING PLAN? For the DASH eating plan, you will follow these general guidelines:  Choose foods with a percent daily value for sodium of less than 5% (as listed on the food label).  Use salt-free seasonings or herbs instead of table salt or sea salt.  Check with your health care provider or pharmacist before using salt substitutes.  Eat lower-sodium products, often labeled as "lower sodium" or "no salt added."  Eat fresh foods.  Eat more vegetables, fruits, and low-fat dairy products.  Choose whole grains. Look for the word "whole" as the first word in the ingredient list.  Choose fish and skinless chicken or turkey more often than red meat. Limit fish, poultry, and meat to 6 oz (170 g) each day.  Limit sweets, desserts, sugars, and sugary drinks.  Choose heart-healthy fats.  Limit cheese to 1 oz (28 g) per day.  Eat more home-cooked food and less restaurant, buffet, and fast food.  Limit fried foods.  Cook foods using methods other than frying.  Limit canned vegetables. If you do use them, rinse them well to decrease the sodium.  When eating at a restaurant, ask that your food be prepared with less salt, or no salt if possible. WHAT FOODS CAN I EAT? Seek help from a dietitian for individual calorie needs. Grains Whole grain or whole wheat bread. Brown rice. Whole grain or whole wheat pasta. Quinoa, bulgur, and whole grain cereals. Low-sodium cereals. Corn or whole wheat flour tortillas. Whole grain cornbread. Whole grain crackers. Low-sodium crackers. Vegetables Fresh or frozen vegetables  (raw, steamed, roasted, or grilled). Low-sodium or reduced-sodium tomato and vegetable juices. Low-sodium or reduced-sodium tomato sauce and paste. Low-sodium or reduced-sodium canned vegetables.  Fruits All fresh, canned (in natural juice), or frozen fruits. Meat and Other Protein Products Ground beef (85% or leaner), grass-fed beef, or beef trimmed of fat. Skinless chicken or turkey. Ground chicken or turkey. Pork trimmed of fat. All fish and seafood. Eggs. Dried beans, peas, or lentils. Unsalted nuts and seeds. Unsalted canned beans. Dairy Low-fat dairy products, such as skim or 1% milk, 2% or reduced-fat cheeses, low-fat ricotta or cottage cheese, or plain low-fat yogurt. Low-sodium or reduced-sodium cheeses. Fats and Oils Tub margarines without trans fats. Light or reduced-fat mayonnaise and salad dressings (reduced sodium). Avocado. Safflower, olive, or canola oils. Natural peanut or almond butter. Other Unsalted popcorn and pretzels. The items listed above may not be a complete list of recommended foods or beverages. Contact your dietitian for more options. WHAT FOODS ARE NOT RECOMMENDED? Grains White bread. White pasta. White rice. Refined cornbread. Bagels and croissants. Crackers that contain trans fat. Vegetables Creamed or fried vegetables. Vegetables in a cheese sauce. Regular canned vegetables. Regular canned tomato sauce and paste. Regular tomato and vegetable juices. Fruits Dried fruits. Canned fruit in light or heavy syrup. Fruit juice. Meat and Other Protein Products Fatty cuts of meat. Ribs, chicken wings, bacon, sausage, bologna, salami, chitterlings, fatback, hot dogs, bratwurst, and packaged luncheon meats. Salted nuts and seeds. Canned beans with salt. Dairy Whole or 2% milk, cream, half-and-half, and cream cheese. Whole-fat or sweetened yogurt. Full-fat   cheeses or blue cheese. Nondairy creamers and whipped toppings. Processed cheese, cheese spreads, or cheese  curds. Condiments Onion and garlic salt, seasoned salt, table salt, and sea salt. Canned and packaged gravies. Worcestershire sauce. Tartar sauce. Barbecue sauce. Teriyaki sauce. Soy sauce, including reduced sodium. Steak sauce. Fish sauce. Oyster sauce. Cocktail sauce. Horseradish. Ketchup and mustard. Meat flavorings and tenderizers. Bouillon cubes. Hot sauce. Tabasco sauce. Marinades. Taco seasonings. Relishes. Fats and Oils Butter, stick margarine, lard, shortening, ghee, and bacon fat. Coconut, palm kernel, or palm oils. Regular salad dressings. Other Pickles and olives. Salted popcorn and pretzels. The items listed above may not be a complete list of foods and beverages to avoid. Contact your dietitian for more information. WHERE CAN I FIND MORE INFORMATION? National Heart, Lung, and Blood Institute: travelstabloid.com   This information is not intended to replace advice given to you by your health care provider. Make sure you discuss any questions you have with your health care provider.   Document Released: 09/23/2011 Document Revised: 10/25/2014 Document Reviewed: 08/08/2013 Elsevier Interactive Patient Education 2016 Elsevier Inc. Lisinopril tablets What is this medicine? LISINOPRIL (lyse IN oh pril) is an ACE inhibitor. This medicine is used to treat high blood pressure and heart failure. It is also used to protect the heart immediately after a heart attack. This medicine may be used for other purposes; ask your health care provider or pharmacist if you have questions. What should I tell my health care provider before I take this medicine? They need to know if you have any of these conditions: -diabetes -heart or blood vessel disease -kidney disease -low blood pressure -previous swelling of the tongue, face, or lips with difficulty breathing, difficulty swallowing, hoarseness, or tightening of the throat -an unusual or allergic reaction to  lisinopril, other ACE inhibitors, insect venom, foods, dyes, or preservatives -pregnant or trying to get pregnant -breast-feeding How should I use this medicine? Take this medicine by mouth with a glass of water. Follow the directions on your prescription label. You may take this medicine with or without food. If it upsets your stomach, take it with food. Take your medicine at regular intervals. Do not take it more often than directed. Do not stop taking except on your doctor's advice. Talk to your pediatrician regarding the use of this medicine in children. Special care may be needed. While this drug may be prescribed for children as young as 51 years of age for selected conditions, precautions do apply. Overdosage: If you think you have taken too much of this medicine contact a poison control center or emergency room at once. NOTE: This medicine is only for you. Do not share this medicine with others. What if I miss a dose? If you miss a dose, take it as soon as you can. If it is almost time for your next dose, take only that dose. Do not take double or extra doses. What may interact with this medicine? Do not take this medicine with any of the following medications: -hymenoptera venomThis medicines may also interact with the following medications: -aliskiren -angiotensin receptor blockers, like losartan or valsartan -certain medicines for diabetes -diuretics -everolimus -gold compounds -lithium -NSAIDs, medicines for pain and inflammation, like ibuprofen or naproxen -potassium salts or supplements -salt substitutes -sirolimus -temsirolimus This list may not describe all possible interactions. Give your health care provider a list of all the medicines, herbs, non-prescription drugs, or dietary supplements you use. Also tell them if you smoke, drink alcohol, or use illegal drugs. Some  items may interact with your medicine. What should I watch for while using this medicine? Visit your  doctor or health care professional for regular check ups. Check your blood pressure as directed. Ask your doctor what your blood pressure should be, and when you should contact him or her. Do not treat yourself for coughs, colds, or pain while you are using this medicine without asking your doctor or health care professional for advice. Some ingredients may increase your blood pressure. Women should inform their doctor if they wish to become pregnant or think they might be pregnant. There is a potential for serious side effects to an unborn child. Talk to your health care professional or pharmacist for more information. Check with your doctor or health care professional if you get an attack of severe diarrhea, nausea and vomiting, or if you sweat a lot. The loss of too much body fluid can make it dangerous for you to take this medicine. You may get drowsy or dizzy. Do not drive, use machinery, or do anything that needs mental alertness until you know how this drug affects you. Do not stand or sit up quickly, especially if you are an older patient. This reduces the risk of dizzy or fainting spells. Alcohol can make you more drowsy and dizzy. Avoid alcoholic drinks. Avoid salt substitutes unless you are told otherwise by your doctor or health care professional. What side effects may I notice from receiving this medicine? Side effects that you should report to your doctor or health care professional as soon as possible: -allergic reactions like skin rash, itching or hives, swelling of the hands, feet, face, lips, throat, or tongue -breathing problems -signs and symptoms of kidney injury like trouble passing urine or change in the amount of urine -signs and symptoms of increased potassium like muscle weakness; chest pain; or fast, irregular heartbeat -signs and symptoms of liver injury like dark yellow or brown urine; general ill feeling or flu-like symptoms; light-colored stools; loss of appetite; nausea;  right upper belly pain; unusually weak or tired; yellowing of the eyes or skin -signs and symptoms of low blood pressure like dizziness; feeling faint or lightheaded, falls; unusually weak or tired -stomach pain with or without nausea and vomiting Side effects that usually do not require medical attention (report to your doctor or health care professional if they continue or are bothersome): -changes in taste -cough -dizziness -fever -headache -sensitivity to light This list may not describe all possible side effects. Call your doctor for medical advice about side effects. You may report side effects to FDA at 1-800-FDA-1088. Where should I keep my medicine? Keep out of the reach of children. Store at room temperature between 15 and 30 degrees C (59 and 86 degrees F). Protect from moisture. Keep container tightly closed. Throw away any unused medicine after the expiration date. NOTE: This sheet is a summary. It may not cover all possible information. If you have questions about this medicine, talk to your doctor, pharmacist, or health care provider.    2016, Elsevier/Gold Standard. (2015-05-29 20:38:20) Hypertension Hypertension, commonly called high blood pressure, is when the force of blood pumping through your arteries is too strong. Your arteries are the blood vessels that carry blood from your heart throughout your body. A blood pressure reading consists of a higher number over a lower number, such as 110/72. The higher number (systolic) is the pressure inside your arteries when your heart pumps. The lower number (diastolic) is the pressure inside your arteries when  your heart relaxes. Ideally you want your blood pressure below 120/80. Hypertension forces your heart to work harder to pump blood. Your arteries may become narrow or stiff. Having untreated or uncontrolled hypertension can cause heart attack, stroke, kidney disease, and other problems. RISK FACTORS Some risk factors for high  blood pressure are controllable. Others are not.  Risk factors you cannot control include:   Race. You may be at higher risk if you are African American.  Age. Risk increases with age.  Gender. Men are at higher risk than women before age 32 years. After age 32, women are at higher risk than men. Risk factors you can control include:  Not getting enough exercise or physical activity.  Being overweight.  Getting too much fat, sugar, calories, or salt in your diet.  Drinking too much alcohol. SIGNS AND SYMPTOMS Hypertension does not usually cause signs or symptoms. Extremely high blood pressure (hypertensive crisis) may cause headache, anxiety, shortness of breath, and nosebleed. DIAGNOSIS To check if you have hypertension, your health care provider will measure your blood pressure while you are seated, with your arm held at the level of your heart. It should be measured at least twice using the same arm. Certain conditions can cause a difference in blood pressure between your right and left arms. A blood pressure reading that is higher than normal on one occasion does not mean that you need treatment. If it is not clear whether you have high blood pressure, you may be asked to return on a different day to have your blood pressure checked again. Or, you may be asked to monitor your blood pressure at home for 1 or more weeks. TREATMENT Treating high blood pressure includes making lifestyle changes and possibly taking medicine. Living a healthy lifestyle can help lower high blood pressure. You may need to change some of your habits. Lifestyle changes may include:  Following the DASH diet. This diet is high in fruits, vegetables, and whole grains. It is low in salt, red meat, and added sugars.  Keep your sodium intake below 2,300 mg per day.  Getting at least 30-45 minutes of aerobic exercise at least 4 times per week.  Losing weight if necessary.  Not smoking.  Limiting alcoholic  beverages.  Learning ways to reduce stress. Your health care provider may prescribe medicine if lifestyle changes are not enough to get your blood pressure under control, and if one of the following is true:  You are 1818-32 years of age and your systolic blood pressure is above 140.  You are 32 years of age or older, and your systolic blood pressure is above 150.  Your diastolic blood pressure is above 90.  You have diabetes, and your systolic blood pressure is over 140 or your diastolic blood pressure is over 90.  You have kidney disease and your blood pressure is above 140/90.  You have heart disease and your blood pressure is above 140/90. Your personal target blood pressure may vary depending on your medical conditions, your age, and other factors. HOME CARE INSTRUCTIONS  Have your blood pressure rechecked as directed by your health care provider.   Take medicines only as directed by your health care provider. Follow the directions carefully. Blood pressure medicines must be taken as prescribed. The medicine does not work as well when you skip doses. Skipping doses also puts you at risk for problems.  Do not smoke.   Monitor your blood pressure at home as directed by your health care  provider. SEEK MEDICAL CARE IF:   You think you are having a reaction to medicines taken.  You have recurrent headaches or feel dizzy.  You have swelling in your ankles.  You have trouble with your vision. SEEK IMMEDIATE MEDICAL CARE IF:  You develop a severe headache or confusion.  You have unusual weakness, numbness, or feel faint.  You have severe chest or abdominal pain.  You vomit repeatedly.  You have trouble breathing. MAKE SURE YOU:   Understand these instructions.  Will watch your condition.  Will get help right away if you are not doing well or get worse.   This information is not intended to replace advice given to you by your health care provider. Make sure you  discuss any questions you have with your health care provider.   Document Released: 10/04/2005 Document Revised: 02/18/2015 Document Reviewed: 07/27/2013 Elsevier Interactive Patient Education [employer]2016 Elsevier Inc.

## 2016-04-16 ENCOUNTER — Other Ambulatory Visit: Payer: Self-pay | Admitting: Family Medicine

## 2016-04-16 DIAGNOSIS — E7800 Pure hypercholesterolemia, unspecified: Secondary | ICD-10-CM

## 2016-04-16 LAB — MICROALBUMIN / CREATININE URINE RATIO
Creatinine, Urine: 538 mg/dL — ABNORMAL HIGH (ref 20–370)
Microalb Creat Ratio: 43 mcg/mg creat — ABNORMAL HIGH (ref <30)
Microalb, Ur: 23.3 mg/dL

## 2016-04-16 MED ORDER — ATORVASTATIN CALCIUM 20 MG PO TABS: 20.0000 mg | ORAL_TABLET | Freq: Every day | ORAL | Status: DC

## 2016-04-16 NOTE — Progress Notes (Signed)
Called and spoke with patient advised of labs and the need to start atorvastatin 20mg  daily. Advised patient to follow low fat/ low carb diet over 5 to 6 small meals daily, drink 6 to 8 glasses of water daily, and exercise 150 minutes weekly of cardio. Patient verbalized understanding and had no other questions at this time. Thanks!

## 2016-05-21 ENCOUNTER — Ambulatory Visit: Payer: Self-pay | Admitting: Family Medicine

## 2017-06-06 ENCOUNTER — Ambulatory Visit (HOSPITAL_COMMUNITY)
Admission: EM | Admit: 2017-06-06 | Discharge: 2017-06-06 | Disposition: A | Discharge status: Home / Self Care | Payer: BLUE CROSS/BLUE SHIELD | Attending: Family Medicine | Admitting: Family Medicine

## 2017-06-06 ENCOUNTER — Encounter (HOSPITAL_COMMUNITY): Payer: Self-pay | Admitting: Emergency Medicine

## 2017-06-06 DIAGNOSIS — H1030 Unspecified acute conjunctivitis, unspecified eye: Secondary | ICD-10-CM | POA: Diagnosis not present

## 2017-06-06 MED ORDER — TOBRAMYCIN 0.3 % OP SOLN: 1.0000 [drp] | OPHTHALMIC | 0 refills | Status: DC

## 2017-06-06 NOTE — ED Provider Notes (Signed)
[facility]    CSN: [id number] Arrival date & time: 06/06/17  1218     History   Chief Complaint Chief Complaint  Patient presents with  . Eye Pain    HPI Derek Lawson is a 31 y.o. male.   Onset 8/17 of symptoms.  Left eye is tearing, crusty around lashes when he woke this morning.  Eye is sore.  Left eye is more blurry than usual  Patient reports he recently obtained new glasses to wear.    Patient has an eye appt this coming Thursday with "my eye doctor"  Patient works doing lawn care.      Past Medical History:  Diagnosis Date  . Asthma     Patient Active Problem List   Diagnosis Date Noted  . Essential hypertension 04/15/2016  . Asthma, mild intermittent 04/15/2016  . Obesity 04/15/2016  . BMI 33.0-33.9,adult 04/15/2016  . Tobacco dependence 04/15/2016    History reviewed. No pertinent surgical history.     Home Medications    Prior to Admission medications   Medication Sig Start Date End Date Taking? Authorizing Provider  albuterol (PROVENTIL HFA;VENTOLIN HFA) 108 (90 BASE) MCG/ACT inhaler Inhale 2 puffs into the lungs every 6 (six) hours as needed for wheezing or shortness of breath. 08/10/14   Rodolph Bong, MD  atorvastatin (LIPITOR) 20 MG tablet Take 1 tablet (20 mg total) by mouth daily. 04/16/16   Massie Maroon, FNP  diphenhydrAMINE (BENADRYL) 25 mg capsule Take 25 mg by mouth every 6 (six) hours as needed for allergies. Reported on 04/15/2016    [provider]  lisinopril (PRINIVIL,ZESTRIL) 10 MG tablet Take 1 tablet (10 mg total) by mouth daily. 04/15/16   Massie Maroon, FNP  montelukast (SINGULAIR) 10 MG tablet Take 1 tablet (10 mg total) by mouth at bedtime. 04/15/16   Massie Maroon, FNP  tobramycin (TOBREX) 0.3 % ophthalmic solution Place 1 drop into the left eye every 4 (four) hours. 06/06/17   Elvina Sidle, MD    Family History Family History  Problem Relation Age of Onset  . Diabetes Father       Social History Social History  Substance Use Topics  . Smoking status: Current Every Day Smoker    Packs/day: 0.50    Years: 0.50    Types: Cigarettes    Last attempt to quit: 08/10/2013  . Smokeless tobacco: Not on file  . Alcohol use Yes     Comment: socially     Allergies   Patient has no known allergies.   Review of Systems Review of Systems  Eyes: Positive for pain, discharge, redness and itching.  All other systems reviewed and are negative.    Physical Exam Triage Vital Signs ED Triage Vitals  Enc Vitals Group     BP 06/06/17 1324 (!) 148/103     Pulse Rate 06/06/17 1324 78     Resp 06/06/17 1324 18     Temp 06/06/17 1324 98.3 F (36.8 C)     Temp Source 06/06/17 1324 Oral     SpO2 06/06/17 1324 100 %     Weight --      Height --      Head Circumference --      Peak Flow --      Pain Score 06/06/17 1322 5     Pain Loc --      Pain Edu? --      Excl. in GC? --  No data found.   Updated Vital Signs BP (!) 148/103 (BP Location: Left Arm)   Pulse 78   Temp 98.3 F (36.8 C) (Oral)   Resp 18   SpO2 100%    Physical Exam  Constitutional: He is oriented to person, place, and time. He appears well-developed and well-nourished.  HENT:  Right Ear: External ear normal.  Left Ear: External ear normal.  Mouth/Throat: Oropharynx is clear and moist.  Eyes: Pupils are equal, round, and reactive to light. EOM are normal. Right eye exhibits no discharge. Left eye exhibits discharge.  Mild left IM injection. There is some mild left periorbital swelling but no tenderness or stye seen.  Neck: Normal range of motion. Neck supple.  Pulmonary/Chest: Effort normal.  Musculoskeletal: Normal range of motion.  Neurological: He is alert and oriented to person, place, and time.  Skin: Skin is warm and dry.  Nursing note and vitals reviewed.    UC Treatments / Results  Labs (all labs ordered are listed, but only abnormal results are displayed) Labs Reviewed  - No data to display  EKG  EKG Interpretation None       Radiology No results found.  Procedures Procedures (including critical care time)  Medications Ordered in UC Medications - No data to display   Initial Impression / Assessment and Plan / UC Course  I have reviewed the triage vital signs and the nursing notes.  Pertinent labs & imaging results that were available during my care of the patient were reviewed by me and considered in my medical decision making (see chart for details).     Final Clinical Impressions(s) / UC Diagnoses   Final diagnoses:  Acute bacterial conjunctivitis, unspecified laterality    New Prescriptions New Prescriptions   TOBRAMYCIN (TOBREX) 0.3 % OPHTHALMIC SOLUTION    Place 1 drop into the left eye every 4 (four) hours.     Controlled Substance Prescriptions [place] Controlled Substance Registry consulted? Not Applicable   Elvina Sidle, MD 06/06/17 1343

## 2017-06-06 NOTE — ED Triage Notes (Addendum)
Onset 8/17 of symptoms.  Left eye is tearing, crusty around lashes when he woke this morning.  Eye is sore.  Left eye is more blurry than usual  Patient reports he recently obtained new glasses to wear.    Patient has an eye appt this coming Thursday with "my eye doctor"

## 2017-06-06 NOTE — Discharge Instructions (Signed)
See your eye doctor as planned this Thursday  Have your blood pressure rechecked next week since it was elevated

## 2017-06-07 ENCOUNTER — Encounter (HOSPITAL_COMMUNITY): Payer: Self-pay | Admitting: Emergency Medicine

## 2017-06-07 ENCOUNTER — Emergency Department (HOSPITAL_COMMUNITY)
Admission: EM | Admit: 2017-06-07 | Discharge: 2017-06-07 | Disposition: A | Discharge status: Home / Self Care | Payer: BLUE CROSS/BLUE SHIELD | Attending: Emergency Medicine | Admitting: Emergency Medicine

## 2017-06-07 DIAGNOSIS — H578 Other specified disorders of eye and adnexa: Secondary | ICD-10-CM | POA: Diagnosis present

## 2017-06-07 DIAGNOSIS — Z79899 Other long term (current) drug therapy: Secondary | ICD-10-CM | POA: Insufficient documentation

## 2017-06-07 DIAGNOSIS — I10 Essential (primary) hypertension: Secondary | ICD-10-CM | POA: Insufficient documentation

## 2017-06-07 DIAGNOSIS — J45909 Unspecified asthma, uncomplicated: Secondary | ICD-10-CM | POA: Insufficient documentation

## 2017-06-07 DIAGNOSIS — F17210 Nicotine dependence, cigarettes, uncomplicated: Secondary | ICD-10-CM | POA: Insufficient documentation

## 2017-06-07 DIAGNOSIS — L03213 Periorbital cellulitis: Secondary | ICD-10-CM | POA: Diagnosis not present

## 2017-06-07 MED ORDER — TETRACAINE HCL 0.5 % OP SOLN
1.0000 [drp] | Freq: Once | OPHTHALMIC | Status: AC
  Administered 2017-06-07: 1 [drp] via OPHTHALMIC
  Filled 2017-06-07: qty 4

## 2017-06-07 MED ORDER — CLINDAMYCIN HCL 300 MG PO CAPS: 300.0000 mg | ORAL_CAPSULE | Freq: Three times a day (TID) | ORAL | 0 refills | Status: DC

## 2017-06-07 MED ORDER — IBUPROFEN 800 MG PO TABS
800.0000 mg | ORAL_TABLET | Freq: Once | ORAL | Status: AC
  Administered 2017-06-07: 800 mg via ORAL
  Filled 2017-06-07: qty 1

## 2017-06-07 MED ORDER — FLUORESCEIN SODIUM 0.6 MG OP STRP
1.0000 | ORAL_STRIP | Freq: Once | OPHTHALMIC | Status: AC
  Administered 2017-06-07: 1 via OPHTHALMIC
  Filled 2017-06-07: qty 1

## 2017-06-07 NOTE — ED Provider Notes (Signed)
[facility]-EMERGENCY DEPT Provider Note   CSN: [id number] Arrival date & time: 06/07/17  1136     History   Chief Complaint Chief Complaint  Patient presents with  . Eye Pain    HPI Derek Lawson is a 31 y.o. male.  HPI   31 year old male presents today with complaints of eye infection. Patient reports that 2 days ago he started to develop redness in the left eye with discharge. He notes minor amount of periocular swelling is worsened today. He was seen by his primary care provider yesterday diagnosed with conjunctivitis and started on tobramycin. Patient notes swelling this continue to worsen, he denies any true pain in the eye, denies any painful ocular movements, reports minor blurring of vision due to the discharge. Patient denies any fever or chills nausea or vomiting. Denies any history of the same. He denies any trauma to the eye. He reports that they stained his eye yesterday and did not find anything significant in it. Patient reports he is nondiabetic. He reports that he took Tylenol yesterday for discomfort, no medications prior to arrival today. He reports he has an ophthalmologist that he'll be following up with 2 days. He reports he does not work contacts.    Past Medical History:  Diagnosis Date  . Asthma     Patient Active Problem List   Diagnosis Date Noted  . Essential hypertension 04/15/2016  . Asthma, mild intermittent 04/15/2016  . Obesity 04/15/2016  . BMI 33.0-33.9,adult 04/15/2016  . Tobacco dependence 04/15/2016    History reviewed. No pertinent surgical history.     Home Medications    Prior to Admission medications   Medication Sig Start Date End Date Taking? Authorizing Provider  albuterol (PROVENTIL HFA;VENTOLIN HFA) 108 (90 BASE) MCG/ACT inhaler Inhale 2 puffs into the lungs every 6 (six) hours as needed for wheezing or shortness of breath. 08/10/14   Rodolph Bong, MD  atorvastatin (LIPITOR) 20 MG tablet Take 1 tablet (20 mg total) by  mouth daily. 04/16/16   Massie Maroon, FNP  clindamycin (CLEOCIN) 300 MG capsule Take 1 capsule (300 mg total) by mouth 3 (three) times daily. 06/07/17   Dailyn Reith, Tinnie Gens, PA-C  diphenhydrAMINE (BENADRYL) 25 mg capsule Take 25 mg by mouth every 6 (six) hours as needed for allergies. Reported on 04/15/2016    [provider]  lisinopril (PRINIVIL,ZESTRIL) 10 MG tablet Take 1 tablet (10 mg total) by mouth daily. 04/15/16   Massie Maroon, FNP  montelukast (SINGULAIR) 10 MG tablet Take 1 tablet (10 mg total) by mouth at bedtime. 04/15/16   Massie Maroon, FNP  tobramycin (TOBREX) 0.3 % ophthalmic solution Place 1 drop into the left eye every 4 (four) hours. 06/06/17   Elvina Sidle, MD    Family History Family History  Problem Relation Age of Onset  . Diabetes Father     Social History Social History  Substance Use Topics  . Smoking status: Current Every Day Smoker    Packs/day: 0.50    Years: 0.50    Types: Cigarettes    Last attempt to quit: 08/10/2013  . Smokeless tobacco: Not on file  . Alcohol use Yes     Comment: socially     Allergies   Patient has no known allergies.   Review of Systems Review of Systems  All other systems reviewed and are negative.   Physical Exam Updated Vital Signs BP (!) 146/110 (BP Location: Right Arm)   Pulse 76   Temp 98.3  F (36.8 C) (Oral)   Resp 18   SpO2 100%   Physical Exam  Constitutional: He is oriented to person, place, and time. He appears well-developed and well-nourished.  HENT:  Head: Normocephalic and atraumatic.  Minor erythema surrounding the left thigh- discharge noted, no significant bulbar palpebral injection. Nontender to palpation of the surrounding tissue, extraocular movements are intact and pain-free. Patient near baseline per patient  No for seen uptake- no corneal defects  Eyes: Pupils are equal, round, and reactive to light. Conjunctivae are normal. Right eye exhibits no discharge. Left eye  exhibits no discharge. No scleral icterus.  Neck: Normal range of motion. No JVD present. No tracheal deviation present.  Pulmonary/Chest: Effort normal. No stridor.  Neurological: He is alert and oriented to person, place, and time. Coordination normal.  Psychiatric: He has a normal mood and affect. His behavior is normal. Judgment and thought content normal.  Nursing note and vitals reviewed.   ED Treatments / Results  Labs (all labs ordered are listed, but only abnormal results are displayed) Labs Reviewed - No data to display  EKG  EKG Interpretation None       Radiology No results found.  Procedures Procedures (including critical care time)  Medications Ordered in ED Medications  ibuprofen (ADVIL,MOTRIN) tablet 800 mg (800 mg Oral Given 06/07/17 1246)  fluorescein ophthalmic strip 1 strip (1 strip Left Eye Given 06/07/17 1247)  tetracaine (PONTOCAINE) 0.5 % ophthalmic solution 1-2 drop (1 drop Left Eye Given by Other 06/07/17 1247)     Initial Impression / Assessment and Plan / ED Course  I have reviewed the triage vital signs and the nursing notes.  Pertinent labs & imaging results that were available during my care of the patient were reviewed by me and considered in my medical decision making (see chart for details).     Final Clinical Impressions(s) / ED Diagnoses   Final diagnoses:  Preseptal cellulitis     Discharge Meds: Clindamycin  Assessment/Plan: Patient's presentation is most consistent with preseptal cellulitis. He has no signs of septal cellulitis. He has pain-free ocular movements, afebrile nontoxic with just very minimal surrounding erythema. Patient has been treated with one day with topical antibiotics, I will add clindamycin given his worsening condition. Porcine SANE shows no uptake, no foreign body or dendritic lesions. No need for emergent ophthalmologist evaluation as patient has a follow-up in 2 days. He is given strict return precautions,  he verbalized understanding and agreement to today's plan.   New Prescriptions New Prescriptions   CLINDAMYCIN (CLEOCIN) 300 MG CAPSULE    Take 1 capsule (300 mg total) by mouth 3 (three) times daily.     Eyvonne Mechanic, PA-C 06/07/17 1326    Geoffery Lyons, MD 06/07/17 [phone]

## 2017-06-07 NOTE — ED Triage Notes (Signed)
Pt here for left eye pain and swelling x 3 days; pt seen at [facility] for same yesterday and sts worsening today

## 2017-06-07 NOTE — Discharge Instructions (Signed)
Please read attached information. If you experience any new or worsening signs or symptoms please return to the emergency room for evaluation. Please follow-up with your primary care provider or specialist as discussed. Please use medication prescribed only as directed and discontinue taking if you have any concerning signs or symptoms.   °

## 2019-06-15 ENCOUNTER — Ambulatory Visit: Payer: Self-pay

## 2019-06-15 ENCOUNTER — Other Ambulatory Visit: Payer: Self-pay | Admitting: Family Medicine

## 2019-06-15 ENCOUNTER — Other Ambulatory Visit: Payer: Self-pay

## 2019-06-15 DIAGNOSIS — M25521 Pain in right elbow: Secondary | ICD-10-CM

## 2019-06-21 ENCOUNTER — Encounter (HOSPITAL_COMMUNITY): Payer: Self-pay | Admitting: Emergency Medicine

## 2019-06-21 ENCOUNTER — Emergency Department (HOSPITAL_COMMUNITY)
Admission: EM | Admit: 2019-06-21 | Discharge: 2019-06-21 | Disposition: A | Discharge status: Home / Self Care | Payer: Self-pay | Attending: Emergency Medicine | Admitting: Emergency Medicine

## 2019-06-21 ENCOUNTER — Emergency Department (HOSPITAL_COMMUNITY): Payer: Self-pay

## 2019-06-21 ENCOUNTER — Other Ambulatory Visit: Payer: Self-pay

## 2019-06-21 DIAGNOSIS — Y999 Unspecified external cause status: Secondary | ICD-10-CM | POA: Insufficient documentation

## 2019-06-21 DIAGNOSIS — I10 Essential (primary) hypertension: Secondary | ICD-10-CM | POA: Insufficient documentation

## 2019-06-21 DIAGNOSIS — S91209A Unspecified open wound of unspecified toe(s) with damage to nail, initial encounter: Secondary | ICD-10-CM

## 2019-06-21 DIAGNOSIS — W2209XA Striking against other stationary object, initial encounter: Secondary | ICD-10-CM | POA: Insufficient documentation

## 2019-06-21 DIAGNOSIS — Z8709 Personal history of other diseases of the respiratory system: Secondary | ICD-10-CM | POA: Insufficient documentation

## 2019-06-21 DIAGNOSIS — Y9301 Activity, walking, marching and hiking: Secondary | ICD-10-CM | POA: Insufficient documentation

## 2019-06-21 DIAGNOSIS — Z79899 Other long term (current) drug therapy: Secondary | ICD-10-CM | POA: Insufficient documentation

## 2019-06-21 DIAGNOSIS — Z23 Encounter for immunization: Secondary | ICD-10-CM | POA: Insufficient documentation

## 2019-06-21 DIAGNOSIS — F17210 Nicotine dependence, cigarettes, uncomplicated: Secondary | ICD-10-CM | POA: Insufficient documentation

## 2019-06-21 DIAGNOSIS — Y929 Unspecified place or not applicable: Secondary | ICD-10-CM | POA: Insufficient documentation

## 2019-06-21 DIAGNOSIS — S91201A Unspecified open wound of right great toe with damage to nail, initial encounter: Secondary | ICD-10-CM | POA: Insufficient documentation

## 2019-06-21 MED ORDER — HYDROCODONE-ACETAMINOPHEN 5-325 MG PO TABS
2.0000 | ORAL_TABLET | Freq: Once | ORAL | Status: AC
  Administered 2019-06-21: 11:00:00 2 via ORAL
  Filled 2019-06-21: qty 2

## 2019-06-21 MED ORDER — TETANUS-DIPHTH-ACELL PERTUSSIS 5-2.5-18.5 LF-MCG/0.5 IM SUSP
0.5000 mL | Freq: Once | INTRAMUSCULAR | Status: AC
  Administered 2019-06-21: 11:00:00 0.5 mL via INTRAMUSCULAR
  Filled 2019-06-21: qty 0.5

## 2019-06-21 MED ORDER — LIDOCAINE HCL (PF) 1 % IJ SOLN
5.0000 mL | Freq: Once | INTRAMUSCULAR | Status: AC
  Administered 2019-06-21: 11:00:00 5 mL
  Filled 2019-06-21: qty 5

## 2019-06-21 MED ORDER — LIDOCAINE HCL (PF) 1 % IJ SOLN
5.0000 mL | Freq: Once | INTRAMUSCULAR | Status: AC
  Administered 2019-06-21: 5 mL
  Filled 2019-06-21: qty 5

## 2019-06-21 MED ORDER — HYDROCODONE-ACETAMINOPHEN 5-325 MG PO TABS
2.0000 | ORAL_TABLET | Freq: Once | ORAL | Status: AC
  Administered 2019-06-21: 2 via ORAL
  Filled 2019-06-21: qty 2

## 2019-06-21 NOTE — ED Triage Notes (Signed)
Pt reports right large toenail lifted up this morning. Needs tetanus updated.

## 2019-06-21 NOTE — ED Notes (Signed)
Patient transported to X-ray 

## 2019-06-21 NOTE — Discharge Instructions (Addendum)
You have been diagnosed today with Nail Avulsion.  At this time there does not appear to be the presence of an emergent medical condition, however there is always the potential for conditions to change. Please read and follow the below instructions.  Please return to the Emergency Department immediately for any new or worsening symptoms. Please be sure to follow up with your Primary Care Provider within one week regarding your visit today; please call their office to schedule an appointment even if you are feeling better for a follow-up visit. Please call the podiatrist Dr. Amalia Hailey office today to schedule a follow-up appointment regarding your toenail injury.  The stitches placed in your toenail will need to be removed within 5-7 days. You have been given pain medication today called Norco.  Do not drive or operate heavy machinery for the rest of the day as this medication may make you drowsy.  Do not drink alcohol or take other sedating medications after taking Norco.  You may use rest, ice, elevation and over-the-counter ibuprofen as directed on the packaging to help with pain.  Get help right away if: You have bleeding that does not stop, even when you apply pressure to the wound. You have a temperature that is higher than 100.48F (38C). The affected finger or toe looks white or black. You have more redness, swelling, or pain around your wound. You have more fluid or blood coming from your wound. Your wound feels warm to the touch. You have pus or a bad smell coming from your wound. You have a fever. Your finger or toe looks blue or black. You have any new/concerning or worsening symptoms  Please read the additional information packets attached to your discharge summary.  Do not take your medicine if  develop an itchy rash, swelling in your mouth or lips, or difficulty breathing; call 911 and seek immediate emergency medical attention if this occurs.

## 2019-06-21 NOTE — ED Notes (Signed)
This NT seen pt getting into car and leaving, waiting to see if pt comes back before moving OTF

## 2019-06-21 NOTE — ED Provider Notes (Signed)
[facility] EMERGENCY DEPARTMENT Provider Note   CSN: [id number] Arrival date & time: 06/21/19  4481     History   Chief Complaint Chief Complaint  Patient presents with  . Toe Pain    HPI Derek Lawson is a 33 y.o. male presents today for right great toenail injury that occurred just prior to arrival.  Patient reports he was walking up some stairs while wearing slippers when he missed the step taking the stairs with his right great toe.  Patient reports that he had an immediate sharp sensation to his toenail constant worse with ambulation improved with rest and elevation, no radiation of pain and he denies any other injuries.  He control bleeding underneath the toenail with direct pressure prior to arrival.  He has no other concerns today.    HPI  Past Medical History:  Diagnosis Date  . Asthma     Patient Active Problem List   Diagnosis Date Noted  . Essential hypertension 04/15/2016  . Asthma, mild intermittent 04/15/2016  . Obesity 04/15/2016  . BMI 33.0-33.9,adult 04/15/2016  . Tobacco dependence 04/15/2016    History reviewed. No pertinent surgical history.      Home Medications    Prior to Admission medications   Medication Sig Start Date End Date Taking? Authorizing Provider  albuterol (PROVENTIL HFA;VENTOLIN HFA) 108 (90 BASE) MCG/ACT inhaler Inhale 2 puffs into the lungs every 6 (six) hours as needed for wheezing or shortness of breath. 08/10/14   Gregor Hams, MD  atorvastatin (LIPITOR) 20 MG tablet Take 1 tablet (20 mg total) by mouth daily. 04/16/16   Dorena Dew, FNP  clindamycin (CLEOCIN) 300 MG capsule Take 1 capsule (300 mg total) by mouth 3 (three) times daily. 06/07/17   Hedges, Dellis Filbert, PA-C  diphenhydrAMINE (BENADRYL) 25 mg capsule Take 25 mg by mouth every 6 (six) hours as needed for allergies. Reported on 04/15/2016    [provider]  lisinopril (PRINIVIL,ZESTRIL) 10 MG tablet Take 1 tablet (10 mg total) by  mouth daily. 04/15/16   Dorena Dew, FNP  montelukast (SINGULAIR) 10 MG tablet Take 1 tablet (10 mg total) by mouth at bedtime. 04/15/16   Dorena Dew, FNP  tobramycin (TOBREX) 0.3 % ophthalmic solution Place 1 drop into the left eye every 4 (four) hours. 06/06/17   Robyn Haber, MD  ALBUTEROL IN Inhale 2 puffs into the lungs 4 (four) times daily as needed.   08/10/14  [provider]    Family History Family History  Problem Relation Age of Onset  . Diabetes Father     Social History Social History   Tobacco Use  . Smoking status: Current Every Day Smoker    Packs/day: 0.50    Years: 0.50    Pack years: 0.25    Types: Cigarettes    Last attempt to quit: 08/10/2013    Years since quitting: 5.8  Substance Use Topics  . Alcohol use: Not Currently    Comment: socially  . Drug use: No     Allergies   Patient has no known allergies.   Review of Systems Review of Systems Ten systems are reviewed and are negative for acute change except as noted in the HPI   Physical Exam Updated Vital Signs BP (!) 142/110 (BP Location: Left Arm)   Pulse 87   Temp 98.4 F (36.9 C) (Oral)   Resp 18   Ht 5\' 9"  (1.753 m)   Wt 102.1 kg  SpO2 99%   BMI 33.23 kg/m   Physical Exam Constitutional:      General: He is not in acute distress.    Appearance: Normal appearance. He is well-developed. He is not ill-appearing or diaphoretic.  HENT:     Head: Normocephalic and atraumatic.     Right Ear: External ear normal.     Left Ear: External ear normal.     Nose: Nose normal.  Eyes:     General: Vision grossly intact. Gaze aligned appropriately.     Pupils: Pupils are equal, round, and reactive to light.  Neck:     Musculoskeletal: Normal range of motion.     Trachea: Trachea and phonation normal. No tracheal deviation.  Cardiovascular:     Pulses:          Dorsalis pedis pulses are 2+ on the right side and 2+ on the left side.  Pulmonary:     Effort:  Pulmonary effort is normal. No respiratory distress.  Abdominal:     General: There is no distension.     Palpations: Abdomen is soft.     Tenderness: There is no abdominal tenderness. There is no guarding or rebound.  Musculoskeletal: Normal range of motion.     Right knee: Normal.     Left knee: Normal.     Right ankle: Normal.     Left ankle: Normal.     Right lower leg: Normal.     Left lower leg: Normal.     Right foot: Tenderness present.     Left foot: Normal.       Feet:  Feet:     Right foot:     Protective Sensation: 5 sites tested. 5 sites sensed.     Left foot:     Protective Sensation: 5 sites tested. 5 sites sensed.     Comments: Avulsion of right great toenail, completely separated except for most proximal lateral edge of nail. - No laceration of nailbed post removal. Skin:    General: Skin is warm and dry.  Neurological:     Mental Status: He is alert.     GCS: GCS eye subscore is 4. GCS verbal subscore is 5. GCS motor subscore is 6.     Comments: Speech is clear and goal oriented, follows commands Major Cranial nerves without deficit, no facial droop Moves extremities without ataxia, coordination intact  Psychiatric:        Behavior: Behavior normal.    ED Treatments / Results  Labs (all labs ordered are listed, but only abnormal results are displayed) Labs Reviewed - No data to display  EKG None  Radiology Dg Toe Great Right  Result Date: 06/21/2019 CLINICAL DATA:  Post blunt trauma to the right big toe. EXAM: RIGHT GREAT TOE COMPARISON:  None. FINDINGS: There is no evidence of fracture or dislocation. There is no evidence of arthropathy or other focal bone abnormality. Hemorrhage under the right first toenail noted. IMPRESSION: Negative. Electronically Signed   By: Ted Mcalpine M.D.   On: 06/21/2019 11:15    Procedures .Nail Removal  Date/Time: 06/21/2019 12:27 PM Performed by: Bill Salinas, PA-C Authorized by: Bill Salinas,  PA-C   Consent:    Consent obtained:  Verbal   Consent given by:  Patient   Risks discussed:  Bleeding, incomplete removal, infection, permanent nail deformity and pain Location:    Foot:  R big toe Pre-procedure details:    Skin preparation:  Betadine   Preparation: Patient was  prepped and draped in the usual sterile fashion   Anesthesia (see MAR for exact dosages):    Anesthesia method:  Nerve block   Block needle gauge:  25 G   Block anesthetic:  Lidocaine 1% w/o epi   Block technique:  Digital   Block injection procedure:  Anatomic landmarks identified, introduced needle, incremental injection, negative aspiration for blood and anatomic landmarks palpated   Block outcome:  Anesthesia achieved Nail Removal:    Nail removed:  Complete   Removed nail replaced and anchored: yes   Post-procedure details:    Dressing:  Post-op shoe, 4x4 sterile gauze and gauze roll   Patient tolerance of procedure:  Tolerated well, no immediate complications   (including critical care time)  Medications Ordered in ED Medications  HYDROcodone-acetaminophen (NORCO/VICODIN) 5-325 MG per tablet 2 tablet (2 tablets Oral Given 06/21/19 1050)  lidocaine (PF) (XYLOCAINE) 1 % injection 5 mL (5 mLs Infiltration Given by Other 06/21/19 1051)  Tdap (BOOSTRIX) injection 0.5 mL (0.5 mLs Intramuscular Given 06/21/19 1051)  lidocaine (PF) (XYLOCAINE) 1 % injection 5 mL (5 mLs Infiltration Given by Other 06/21/19 1243)  HYDROcodone-acetaminophen (NORCO/VICODIN) 5-325 MG per tablet 2 tablet (2 tablets Oral Given 06/21/19 1302)     Initial Impression / Assessment and Plan / ED Course  I have reviewed the triage vital signs and the nursing notes.  Pertinent labs & imaging results that were available during my care of the patient were reviewed by me and considered in my medical decision making (see chart for details).     DG Right Great Toe:  IMPRESSION:  Negative.   33 year old male presents today after nearly  complete avulsion of the right great toenail after kicking a step today.  Patient neurovascular intact to the lower extremity.  Consent obtained for procedure with understanding of risks and benefits. Nail was removed as above, cleaned thoroughly and anchored with two 3-0 Prolene sutures with approximation within the nail fold.  No laceration of the nailbed. Wound was dressed and postop shoe was provided.  Tdap updated today.  Patient states understanding that despite attempts today that permanent nail deformity may be possible.  He states understanding to call podiatrist office today to schedule a follow-up appointment for early next week and for suture removal.  No indication for antibiotics at this time. Additionally pain was controlled in department with Norco, he states understanding of narcotic precautions and his mother is to drive home.  At this time there does not appear to be any evidence of an acute emergency medical condition and the patient appears stable for discharge with appropriate outpatient follow up. Diagnosis was discussed with patient who verbalizes understanding of care plan and is agreeable to discharge. I have discussed return precautions with patient who verbalizes understanding of return precautions. Patient encouraged to follow-up with their PCP and Podiatry. All questions answered.  Patient's case discussed with Dr. Silverio LayYao during this visit.  Note: Portions of this report may have been transcribed using voice recognition software. Every effort was made to ensure accuracy; however, inadvertent computerized transcription errors may still be present. Final Clinical Impressions(s) / ED Diagnoses   Final diagnoses:  Nail avulsion of toe, initial encounter    ED Discharge Orders    None       Elizabeth PalauMorelli, Britani Beattie A, PA-C 06/21/19 1316    Charlynne PanderYao, David Hsienta, MD 06/21/19 1401

## 2019-07-17 ENCOUNTER — Ambulatory Visit (HOSPITAL_COMMUNITY)
Admission: EM | Admit: 2019-07-17 | Discharge: 2019-07-17 | Disposition: A | Discharge status: Home / Self Care | Payer: Self-pay

## 2019-07-17 ENCOUNTER — Encounter (HOSPITAL_COMMUNITY): Payer: Self-pay

## 2019-07-17 DIAGNOSIS — M5441 Lumbago with sciatica, right side: Secondary | ICD-10-CM

## 2019-07-17 MED ORDER — CYCLOBENZAPRINE HCL 10 MG PO TABS: 10.0000 mg | ORAL_TABLET | Freq: Two times a day (BID) | ORAL | 0 refills | Status: DC | PRN

## 2019-07-17 MED ORDER — IBUPROFEN 800 MG PO TABS: 800.0000 mg | ORAL_TABLET | Freq: Three times a day (TID) | ORAL | 0 refills | Status: DC | PRN

## 2019-07-17 NOTE — ED Triage Notes (Signed)
Patient report he was changing a tire 1 day ago  and his back popped, he is having back pain, he denies any irradiation to his legs. He is taking ibuprofen.

## 2019-07-17 NOTE — Discharge Instructions (Addendum)
Take the prescribed ibuprofen as needed for your pain.  Take the muscle relaxer Flexeril as needed for muscle spasm; do not drive, operate machinery, or drink alcohol with this medication as it may make you drowsy.    Return here or follow up with your primary care provider if your pain is not improving or gets worse; or if you develop new symptoms such as difficulty with urination, weakness, numbness, loss of control of your bladder or bowels, fever, or chills.     Your blood pressure is elevated today at  142/102  Please have this rechecked by your primary care provider in 2 weeks.  If you do not have a primary care provider, one is suggested below.

## 2019-07-17 NOTE — ED Provider Notes (Signed)
[facility]    CSN: [id number] Arrival date & time: 07/17/19  1232      History   Chief Complaint Chief Complaint  Patient presents with  . Back Pain    HPI Derek Lawson is a 33 y.o. male.   Patient presents with a "muscle strain" in his right lower back after changing a tire yesterday.  He states the pain radiates to the posterior right thigh.  He denies weakness, numbness, bowel/bladder incontinence, or other symptoms.  The pain is worse with bending and movement; it improves with rest.  Patient has attempted treatment at home with ibuprofen with moderate relief.  Medical history significant for hypertension, asthma, obesity, tobacco dependence.  The history is provided by the patient.    Past Medical History:  Diagnosis Date  . Asthma     Patient Active Problem List   Diagnosis Date Noted  . Essential hypertension 04/15/2016  . Asthma, mild intermittent 04/15/2016  . Obesity 04/15/2016  . BMI 33.0-33.9,adult 04/15/2016  . Tobacco dependence 04/15/2016    History reviewed. No pertinent surgical history.     Home Medications    Prior to Admission medications   Medication Sig Start Date End Date Taking? Authorizing Provider  albuterol (PROVENTIL HFA;VENTOLIN HFA) 108 (90 BASE) MCG/ACT inhaler Inhale 2 puffs into the lungs every 6 (six) hours as needed for wheezing or shortness of breath. 08/10/14   Rodolph Bong, MD  atorvastatin (LIPITOR) 20 MG tablet Take 1 tablet (20 mg total) by mouth daily. 04/16/16   Massie Maroon, FNP  clindamycin (CLEOCIN) 300 MG capsule Take 1 capsule (300 mg total) by mouth 3 (three) times daily. 06/07/17   Hedges, Tinnie Gens, PA-C  cyclobenzaprine (FLEXERIL) 10 MG tablet Take 1 tablet (10 mg total) by mouth 2 (two) times daily as needed for muscle spasms. 07/17/19   Mickie Bail, NP  diphenhydrAMINE (BENADRYL) 25 mg capsule Take 25 mg by mouth every 6 (six) hours as needed for allergies. Reported on 04/15/2016    [provider]  ibuprofen (ADVIL) 800 MG tablet Take 1 tablet (800 mg total) by mouth every 8 (eight) hours as needed. 07/17/19   Mickie Bail, NP  lisinopril (PRINIVIL,ZESTRIL) 10 MG tablet Take 1 tablet (10 mg total) by mouth daily. 04/15/16   Massie Maroon, FNP  montelukast (SINGULAIR) 10 MG tablet Take 1 tablet (10 mg total) by mouth at bedtime. 04/15/16   Massie Maroon, FNP  tobramycin (TOBREX) 0.3 % ophthalmic solution Place 1 drop into the left eye every 4 (four) hours. 06/06/17   Elvina Sidle, MD  ALBUTEROL IN Inhale 2 puffs into the lungs 4 (four) times daily as needed.   08/10/14  [provider]    Family History Family History  Problem Relation Age of Onset  . Diabetes Father     Social History Social History   Tobacco Use  . Smoking status: Current Every Day Smoker    Packs/day: 0.50    Years: 0.50    Pack years: 0.25    Types: Cigarettes    Last attempt to quit: 08/10/2013    Years since quitting: 5.9  . Smokeless tobacco: Never Used  Substance Use Topics  . Alcohol use: Not Currently    Comment: socially  . Drug use: No     Allergies   Patient has no known allergies.   Review of Systems Review of Systems  Constitutional: Negative for chills and fever.  HENT: Negative for  ear pain and sore throat.   Eyes: Negative for pain and visual disturbance.  Respiratory: Negative for cough and shortness of breath.   Cardiovascular: Negative for chest pain and palpitations.  Gastrointestinal: Negative for abdominal pain and vomiting.  Genitourinary: Negative for dysuria and hematuria.  Musculoskeletal: Positive for back pain. Negative for arthralgias.  Skin: Negative for color change and rash.  Neurological: Negative for seizures and syncope.  All other systems reviewed and are negative.    Physical Exam Triage Vital Signs ED Triage Vitals  Enc Vitals Group     BP 07/17/19 1255 (!) 142/102     Pulse Rate 07/17/19 1255 99     Resp 07/17/19  1255 17     Temp 07/17/19 1255 97.8 F (36.6 C)     Temp Source 07/17/19 1255 Temporal     SpO2 07/17/19 1255 95 %     Weight --      Height --      Head Circumference --      Peak Flow --      Pain Score 07/17/19 1254 10     Pain Loc --      Pain Edu? --      Excl. in [place]? --    No data found.  Updated Vital Signs BP (!) 142/102 (BP Location: Left Arm)   Pulse 99   Temp 97.8 F (36.6 C) (Temporal)   Resp 17   SpO2 95%   Visual Acuity Right Eye Distance:   Left Eye Distance:   Bilateral Distance:    Right Eye Near:   Left Eye Near:    Bilateral Near:     Physical Exam Vitals signs and nursing note reviewed.  Constitutional:      General: He is not in acute distress.    Appearance: He is well-developed. He is not ill-appearing.  HENT:     Head: Normocephalic and atraumatic.  Eyes:     Conjunctiva/sclera: Conjunctivae normal.  Neck:     Musculoskeletal: Neck supple.  Cardiovascular:     Rate and Rhythm: Normal rate and regular rhythm.     Heart sounds: No murmur.  Pulmonary:     Effort: Pulmonary effort is normal. No respiratory distress.     Breath sounds: Normal breath sounds.  Abdominal:     Palpations: Abdomen is soft.     Tenderness: There is no abdominal tenderness. There is no guarding or rebound.  Musculoskeletal: Normal range of motion.        General: No swelling, tenderness, deformity or signs of injury.  Skin:    General: Skin is warm and dry.     Capillary Refill: Capillary refill takes less than 2 seconds.     Findings: No bruising, erythema, lesion or rash.  Neurological:     General: No focal deficit present.     Mental Status: He is alert and oriented to person, place, and time.     Sensory: No sensory deficit.     Motor: No weakness.     Gait: Gait normal.      UC Treatments / Results  Labs (all labs ordered are listed, but only abnormal results are displayed) Labs Reviewed - No data to display  EKG   Radiology No results  found.  Procedures Procedures (including critical care time)  Medications Ordered in UC Medications - No data to display  Initial Impression / Assessment and Plan / UC Course  I have reviewed the triage vital signs and the  nursing notes.  Pertinent labs & imaging results that were available during my care of the patient were reviewed by me and considered in my medical decision making (see chart for details).    Right side lower back pain with sciatica.  Treating with ibuprofen and Flexeril.  Precautions for drowsiness with Flexeril discussed with patient.  Instructed him to return here or follow-up with his PCP if his pain is not improving or he develops new symptoms.  Discussed that his blood pressure is elevated today and this needs to be rechecked by his PCP in 2 to 4 weeks.  Patient agrees to plan of care.     Final Clinical Impressions(s) / UC Diagnoses   Final diagnoses:  Acute right-sided low back pain with right-sided sciatica     Discharge Instructions     Take the prescribed ibuprofen as needed for your pain.  Take the muscle relaxer Flexeril as needed for muscle spasm; do not drive, operate machinery, or drink alcohol with this medication as it may make you drowsy.    Return here or follow up with your primary care provider if your pain is not improving or gets worse; or if you develop new symptoms such as difficulty with urination, weakness, numbness, loss of control of your bladder or bowels, fever, or chills.     Your blood pressure is elevated today at  142/102  Please have this rechecked by your primary care provider in 2 weeks.  If you do not have a primary care provider, one is suggested below.       ED Prescriptions    Medication Sig Dispense Auth. Provider   ibuprofen (ADVIL) 800 MG tablet Take 1 tablet (800 mg total) by mouth every 8 (eight) hours as needed. 21 tablet Mickie Bailate, Braiden Rodman H, NP   cyclobenzaprine (FLEXERIL) 10 MG tablet Take 1 tablet (10 mg total) by  mouth 2 (two) times daily as needed for muscle spasms. 20 tablet Mickie Bailate, Jeslin Bazinet H, NP     I have reviewed the PDMP during this encounter.   Mickie Bailate, Mija Effertz H, NP 07/17/19 1314

## 2020-01-05 ENCOUNTER — Encounter (HOSPITAL_COMMUNITY): Payer: Self-pay | Admitting: Emergency Medicine

## 2020-01-05 ENCOUNTER — Other Ambulatory Visit: Payer: Self-pay

## 2020-01-05 ENCOUNTER — Ambulatory Visit (HOSPITAL_COMMUNITY)
Admission: EM | Admit: 2020-01-05 | Discharge: 2020-01-05 | Disposition: A | Discharge status: Home / Self Care | Payer: Self-pay | Attending: Family Medicine | Admitting: Family Medicine

## 2020-01-05 DIAGNOSIS — H5712 Ocular pain, left eye: Secondary | ICD-10-CM

## 2020-01-05 DIAGNOSIS — S0502XA Injury of conjunctiva and corneal abrasion without foreign body, left eye, initial encounter: Secondary | ICD-10-CM

## 2020-01-05 HISTORY — DX: Essential (primary) hypertension: I10

## 2020-01-05 MED ORDER — FLUORESCEIN SODIUM 1 MG OP STRP
ORAL_STRIP | OPHTHALMIC | Status: AC
  Filled 2020-01-05: qty 1

## 2020-01-05 MED ORDER — TOBRAMYCIN-DEXAMETHASONE 0.3-0.1 % OP SUSP: 1.0000 [drp] | OPHTHALMIC | 0 refills | Status: AC

## 2020-01-05 NOTE — Discharge Instructions (Signed)
You have a corneal abrasion, or trauma to the lining outside of your eye. Use the eye drops that have been prescribed every 4 hours while awake.   Follow up here tomorrow.

## 2020-01-05 NOTE — ED Triage Notes (Signed)
Pt here for left eye pain and irritation; pt with vision issues in that eye per norm

## 2020-01-05 NOTE — ED Provider Notes (Addendum)
[facility]    CSN: [id number] Arrival date & time: 01/05/20  1153      History   Chief Complaint Chief Complaint  Patient presents with  . Eye Pain    HPI Tayon R Apolinar is a 34 y.o. male.   Patient reports that he feels like he has something in his left eye.  Reports he may have gotten his shirt sleeve in his eye and it scraped across.  History significant for hypertension, asthma, obesity, smoker per chart review.  Reports that it happened at about 930 this morning.  Reports that it has not changed his vision, he is not having any pain, just irritation and foreign body sensation.  Denies headache, cough, fever, nausea, vomiting, diarrhea, rash, other symptoms.  The history is provided by the patient.  Eye Pain Pertinent negatives include no chest pain, no abdominal pain and no shortness of breath.    Past Medical History:  Diagnosis Date  . Asthma   . Hypertension     Patient Active Problem List   Diagnosis Date Noted  . Essential hypertension 04/15/2016  . Asthma, mild intermittent 04/15/2016  . Obesity 04/15/2016  . BMI 33.0-33.9,adult 04/15/2016  . Tobacco dependence 04/15/2016    History reviewed. No pertinent surgical history.     Home Medications    Prior to Admission medications   Medication Sig Start Date End Date Taking? Authorizing Provider  albuterol (PROVENTIL HFA;VENTOLIN HFA) 108 (90 BASE) MCG/ACT inhaler Inhale 2 puffs into the lungs every 6 (six) hours as needed for wheezing or shortness of breath. 08/10/14   Rodolph Bong, MD  atorvastatin (LIPITOR) 20 MG tablet Take 1 tablet (20 mg total) by mouth daily. 04/16/16   Massie Maroon, FNP  clindamycin (CLEOCIN) 300 MG capsule Take 1 capsule (300 mg total) by mouth 3 (three) times daily. Patient not taking: Reported on 01/05/2020 06/07/17   Hedges, Tinnie Gens, PA-C  cyclobenzaprine (FLEXERIL) 10 MG tablet Take 1 tablet (10 mg total) by mouth 2 (two) times daily as needed for muscle  spasms. Patient not taking: Reported on 01/05/2020 07/17/19   Mickie Bail, NP  diphenhydrAMINE (BENADRYL) 25 mg capsule Take 25 mg by mouth every 6 (six) hours as needed for allergies. Reported on 04/15/2016    [provider]  ibuprofen (ADVIL) 800 MG tablet Take 1 tablet (800 mg total) by mouth every 8 (eight) hours as needed. 07/17/19   Mickie Bail, NP  lisinopril (PRINIVIL,ZESTRIL) 10 MG tablet Take 1 tablet (10 mg total) by mouth daily. 04/15/16   Massie Maroon, FNP  montelukast (SINGULAIR) 10 MG tablet Take 1 tablet (10 mg total) by mouth at bedtime. 04/15/16   Massie Maroon, FNP  tobramycin (TOBREX) 0.3 % ophthalmic solution Place 1 drop into the left eye every 4 (four) hours. Patient not taking: Reported on 01/05/2020 06/06/17   Elvina Sidle, MD  tobramycin-dexamethasone [facility]) ophthalmic solution Place 1 drop into the left eye every 4 (four) hours while awake for 3 days. 01/05/20 01/08/20  Moshe Cipro, NP  ALBUTEROL IN Inhale 2 puffs into the lungs 4 (four) times daily as needed.   08/10/14  [provider]    Family History Family History  Problem Relation Age of Onset  . Diabetes Father     Social History Social History   Tobacco Use  . Smoking status: Current Every Day Smoker    Packs/day: 0.50    Years: 0.50    Pack years: 0.25  Types: Cigarettes    Last attempt to quit: 08/10/2013    Years since quitting: 6.4  . Smokeless tobacco: Never Used  Substance Use Topics  . Alcohol use: Not Currently    Comment: socially  . Drug use: No     Allergies   Patient has no known allergies.   Review of Systems Review of Systems  Constitutional: Negative for chills and fever.  HENT: Negative for ear pain and sore throat.   Eyes: Positive for pain and itching. Negative for visual disturbance.  Respiratory: Negative.  Negative for cough and shortness of breath.   Cardiovascular: Negative.  Negative for chest pain and palpitations.   Gastrointestinal: Negative.  Negative for abdominal pain and vomiting.  Genitourinary: Negative for dysuria and hematuria.  Musculoskeletal: Negative.  Negative for arthralgias and back pain.  Skin: Negative for color change and rash.  Neurological: Negative.  Negative for seizures and syncope.  Psychiatric/Behavioral: Negative.   All other systems reviewed and are negative.    Physical Exam Triage Vital Signs ED Triage Vitals  Enc Vitals Group     BP 01/05/20 1213 (!) 168/101     Pulse Rate 01/05/20 1213 62     Resp 01/05/20 1213 18     Temp 01/05/20 1213 98.3 F (36.8 C)     Temp Source 01/05/20 1213 Oral     SpO2 01/05/20 1213 99 %     Weight --      Height --      Head Circumference --      Peak Flow --      Pain Score 01/05/20 1214 6     Pain Loc --      Pain Edu? --      Excl. in [place]? --    No data found.  Updated Vital Signs BP (!) 168/101 (BP Location: Right Arm)   Pulse 62   Temp 98.3 F (36.8 C) (Oral)   Resp 18   SpO2 99%   Visual Acuity Right Eye Distance: 20/40 Left Eye Distance: unable to obtain(pt with vision abnomality in eye per norm unable to see chart) Bilateral Distance: 20/40  Right Eye Near:   Left Eye Near:    Bilateral Near:     Physical Exam Vitals and nursing note reviewed.  Constitutional:      General: He is not in acute distress.    Appearance: Normal appearance. He is well-developed. He is obese.  HENT:     Head: Normocephalic and atraumatic.     Nose: Nose normal.     Mouth/Throat:     Mouth: Mucous membranes are moist.     Pharynx: Oropharynx is clear.  Eyes:     General: Lids are normal. Lids are everted, no foreign bodies appreciated. Vision grossly intact. Gaze aligned appropriately.        Left eye: No foreign body, discharge or hordeolum.     Extraocular Movements: Extraocular movements intact.     Left eye: Normal extraocular motion and no nystagmus.     Conjunctiva/sclera: Conjunctivae normal.     Left eye: Left  conjunctiva is not injected. No chemosis, exudate or hemorrhage.    Pupils: Pupils are equal, round, and reactive to light.      Comments: Area of fluorescence on exam with fluorescein and Wood's lamp.  Cardiovascular:     Rate and Rhythm: Normal rate and regular rhythm.     Heart sounds: No murmur.  Pulmonary:     Effort: Pulmonary effort  is normal. No respiratory distress.     Breath sounds: Normal breath sounds.  Abdominal:     Palpations: Abdomen is soft.     Tenderness: There is no abdominal tenderness.  Musculoskeletal:     Cervical back: Neck supple.  Skin:    General: Skin is warm and dry.  Neurological:     Mental Status: He is alert.      UC Treatments / Results  Labs (all labs ordered are listed, but only abnormal results are displayed) Labs Reviewed - No data to display  EKG   Radiology No results found.  Procedures Procedures (including critical care time)  Medications Ordered in UC Medications - No data to display  Initial Impression / Assessment and Plan / UC Course  I have reviewed the triage vital signs and the nursing notes.  Pertinent labs & imaging results that were available during my care of the patient were reviewed by me and considered in my medical decision making (see chart for details).     Corneal abrasion to left eye, covering visual field.  Area of fluorescence in diagram above.  Inflammation present, sclera both are white.  EOMs intact, gaze appropriate. Consulted with Dr Milus Glazier in exam room. TobraDex drops prescribed, drop in the left eye every 4 hours while awake.  Patient instructed to follow-up with this office tomorrow for reevaluation.  Instructed to go to the ER for sudden change in vision, vision loss, severe headache, high fever, other concerning symptoms. Final Clinical Impressions(s) / UC Diagnoses   Final diagnoses:  Abrasion of left cornea, initial encounter  Left eye pain     Discharge Instructions     You have  a corneal abrasion, or trauma to the lining outside of your eye. Use the eye drops that have been prescribed every 4 hours while awake.   Follow up here tomorrow.     ED Prescriptions    Medication Sig Dispense Auth. Provider   tobramycin-dexamethasone [facility]) ophthalmic solution Place 1 drop into the left eye every 4 (four) hours while awake for 3 days. 5 mL Moshe Cipro, NP     PDMP not reviewed this encounter.   Moshe Cipro, NP 01/05/20 1308    Moshe Cipro, NP 01/05/20 1309

## 2020-01-26 ENCOUNTER — Ambulatory Visit: Payer: Self-pay | Attending: Internal Medicine

## 2020-01-26 DIAGNOSIS — Z23 Encounter for immunization: Secondary | ICD-10-CM

## 2020-01-26 NOTE — Progress Notes (Signed)
   Covid-19 Vaccination Clinic  Name:  Derek Lawson    MRN: [id number] DOB: 10-29-1985  01/26/2020  Derek Lawson was observed post Covid-19 immunization for 15 minutes without incident. He was provided with Vaccine Information Sheet and instruction to access the V-Safe system.   Derek Lawson was instructed to call 911 with any severe reactions post vaccine: Marland Kitchen Difficulty breathing  . Swelling of face and throat  . A fast heartbeat  . A bad rash all over body  . Dizziness and weakness   Immunizations Administered    Name Date Dose VIS Date Route   Pfizer COVID-19 Vaccine 01/26/2020  1:59 PM 0.3 mL 09/28/2019 Intramuscular   Manufacturer: [employer], [employer]   Lot: [id number]   NDC: [address]-9

## 2020-02-19 ENCOUNTER — Ambulatory Visit: Payer: BC Managed Care – PPO | Attending: Internal Medicine

## 2020-02-19 DIAGNOSIS — Z23 Encounter for immunization: Secondary | ICD-10-CM

## 2020-02-19 NOTE — Progress Notes (Signed)
   Covid-19 Vaccination Clinic  Name:  Derek Lawson    MRN: [id number] DOB: 07-03-1986  02/19/2020  [name] was observed post Covid-19 immunization for 15 minutes without incident. He was provided with Vaccine Information Sheet and instruction to access the V-Safe system.   [name] was instructed to call 911 with any severe reactions post vaccine: Marland Kitchen Difficulty breathing  . Swelling of face and throat  . A fast heartbeat  . A bad rash all over body  . Dizziness and weakness   Immunizations Administered    Name Date Dose VIS Date Route   Pfizer COVID-19 Vaccine 02/19/2020  4:37 PM 0.3 mL 12/12/2018 Intramuscular   Manufacturer: [employer], [employer]   Lot: [id number]   NDC: [address]-9

## 2020-02-22 ENCOUNTER — Ambulatory Visit (HOSPITAL_COMMUNITY)
Admission: EM | Admit: 2020-02-22 | Discharge: 2020-02-22 | Disposition: A | Discharge status: Home / Self Care | Payer: BC Managed Care – PPO | Attending: Internal Medicine | Admitting: Internal Medicine

## 2020-02-22 ENCOUNTER — Encounter (HOSPITAL_COMMUNITY): Payer: Self-pay

## 2020-02-22 ENCOUNTER — Other Ambulatory Visit: Payer: Self-pay

## 2020-02-22 DIAGNOSIS — K047 Periapical abscess without sinus: Secondary | ICD-10-CM | POA: Diagnosis not present

## 2020-02-22 MED ORDER — KETOROLAC TROMETHAMINE 30 MG/ML IJ SOLN
INTRAMUSCULAR | Status: AC
  Filled 2020-02-22: qty 1

## 2020-02-22 MED ORDER — KETOROLAC TROMETHAMINE 30 MG/ML IJ SOLN
30.0000 mg | Freq: Once | INTRAMUSCULAR | Status: AC
  Administered 2020-02-22: 30 mg via INTRAMUSCULAR

## 2020-02-22 MED ORDER — AMOXICILLIN-POT CLAVULANATE 875-125 MG PO TABS: 1.0000 | ORAL_TABLET | Freq: Two times a day (BID) | ORAL | 0 refills | Status: DC

## 2020-02-22 MED ORDER — IBUPROFEN 600 MG PO TABS: 600.0000 mg | ORAL_TABLET | Freq: Four times a day (QID) | ORAL | 0 refills | Status: DC | PRN

## 2020-02-22 NOTE — ED Triage Notes (Signed)
Pt c/o onset of swelling/pain oral area at right lower tooth/gum line two days ago, reports onset of sore throat and right ear as well.  Swelling/tenderness noted to right cervical/mandibular lymph nodes.  Denies fever, n/v or chills or other URI symptoms. Last dose of ibuprofen last night.

## 2020-02-22 NOTE — ED Provider Notes (Signed)
[place]    CSN: [id number] Arrival date & time: 02/22/20  4008      History   Chief Complaint Chief Complaint  Patient presents with  . Dental Pain  . Otalgia    HPI Derek Lawson is a 34 y.o. male comes to the urgent care with a 2-day history of right jaw pain.  Pain is currently severe, 10 out of 10, sharp, constant and radiates to the right ear.  It is associated with the sore throat.  No trauma to the right side of the face.  Patient denies any recent dental work or routine follow-up.  No fever or chills.  No nausea or vomiting.  No jaw swelling.   HPI  Past Medical History:  Diagnosis Date  . Asthma   . Hypertension     Patient Active Problem List   Diagnosis Date Noted  . Essential hypertension 04/15/2016  . Asthma, mild intermittent 04/15/2016  . Obesity 04/15/2016  . BMI 33.0-33.9,adult 04/15/2016  . Tobacco dependence 04/15/2016    History reviewed. No pertinent surgical history.     Home Medications    Prior to Admission medications   Medication Sig Start Date End Date Taking? Authorizing Provider  cetirizine (ZYRTEC) 10 MG chewable tablet Chew 10 mg by mouth daily.   Yes [provider]  loratadine (CLARITIN) 10 MG tablet Take 10 mg by mouth daily.   Yes [provider]  amoxicillin-clavulanate (AUGMENTIN) 875-125 MG tablet Take 1 tablet by mouth every 12 (twelve) hours. 02/22/20   Chase Picket, MD  diphenhydrAMINE (BENADRYL) 25 mg capsule Take 25 mg by mouth every 6 (six) hours as needed for allergies. Reported on 04/15/2016    [provider]  ibuprofen (ADVIL) 600 MG tablet Take 1 tablet (600 mg total) by mouth every 6 (six) hours as needed. 02/22/20   Nasiir Monts, Myrene Galas, MD  lisinopril (PRINIVIL,ZESTRIL) 10 MG tablet Take 1 tablet (10 mg total) by mouth daily. 04/15/16   Dorena Dew, FNP  albuterol (PROVENTIL HFA;VENTOLIN HFA) 108 (90 BASE) MCG/ACT inhaler Inhale 2 puffs into the lungs every 6 (six)  hours as needed for wheezing or shortness of breath. 08/10/14 02/22/20  Gregor Hams, MD  ALBUTEROL IN Inhale 2 puffs into the lungs 4 (four) times daily as needed.   08/10/14  [provider]  atorvastatin (LIPITOR) 20 MG tablet Take 1 tablet (20 mg total) by mouth daily. 04/16/16 02/22/20  Dorena Dew, FNP  montelukast (SINGULAIR) 10 MG tablet Take 1 tablet (10 mg total) by mouth at bedtime. 04/15/16 02/22/20  Dorena Dew, FNP    Family History Family History  Problem Relation Age of Onset  . Diabetes Father     Social History Social History   Tobacco Use  . Smoking status: Current Every Day Smoker    Packs/day: 0.50    Years: 0.50    Pack years: 0.25    Types: Cigarettes    Last attempt to quit: 08/10/2013    Years since quitting: 6.5  . Smokeless tobacco: Never Used  Substance Use Topics  . Alcohol use: Not Currently    Comment: socially  . Drug use: No     Allergies   Charentais melon (french melon) and Shellfish allergy   Review of Systems Review of Systems  Constitutional: Negative for activity change, chills, fatigue and fever.  HENT: Positive for dental problem and sore throat. Negative for mouth sores and postnasal drip.   Respiratory: Negative.  Gastrointestinal: Negative.   Endocrine: Negative.   Musculoskeletal: Negative.  Negative for arthralgias and joint swelling.  Skin: Negative.      Physical Exam Triage Vital Signs ED Triage Vitals  Enc Vitals Group     BP 02/22/20 1002 (!) 137/99     Pulse Rate 02/22/20 1002 81     Resp 02/22/20 1002 18     Temp 02/22/20 1002 98.4 F (36.9 C)     Temp Source 02/22/20 1002 Oral     SpO2 02/22/20 1002 100 %     Weight --      Height --      Head Circumference --      Peak Flow --      Pain Score 02/22/20 1005 8     Pain Loc --      Pain Edu? --      Excl. in GC? --    No data found.  Updated Vital Signs BP (!) 137/99 (BP Location: Right Arm)   Pulse 81   Temp 98.4 F (36.9 C)  (Oral)   Resp 18   SpO2 100%   Visual Acuity Right Eye Distance:   Left Eye Distance:   Bilateral Distance:    Right Eye Near:   Left Eye Near:    Bilateral Near:     Physical Exam Constitutional:      General: He is not in acute distress.    Appearance: Normal appearance. He is not ill-appearing.  HENT:     Mouth/Throat:     Mouth: Mucous membranes are moist.     Pharynx: No posterior oropharyngeal erythema.     Comments: Poor dental hygiene.  Dental cavity in the third right mandibular molar.  Swelling around the tooth in question.  No purulent discharge noted. Cardiovascular:     Rate and Rhythm: Normal rate and regular rhythm.     Pulses: Normal pulses.     Heart sounds: Normal heart sounds. No murmur. No friction rub.  Pulmonary:     Effort: Pulmonary effort is normal.  Neurological:     Mental Status: He is alert.      UC Treatments / Results  Labs (all labs ordered are listed, but only abnormal results are displayed) Labs Reviewed - No data to display  EKG   Radiology No results found.  Procedures Procedures (including critical care time)  Medications Ordered in UC Medications  ketorolac (TORADOL) 30 MG/ML injection 30 mg (has no administration in time range)    Initial Impression / Assessment and Plan / UC Course  I have reviewed the triage vital signs and the nursing notes.  Pertinent labs & imaging results that were available during my care of the patient were reviewed by me and considered in my medical decision making (see chart for details).     1.  Dental infection: Toradol 30 mg IM x1 dose Augmentin 875-125 mg twice daily for 7 days Ibuprofen 600 mg every 6 hours as needed Patient will need to follow-up with the dentist for dental care.  Patient is aware and verbalizes understanding about the importance of following up with a dentist. Return precautions given. Final Clinical Impressions(s) / UC Diagnoses   Final diagnoses:  Dental  infection   Discharge Instructions   None    ED Prescriptions    Medication Sig Dispense Auth. Provider   amoxicillin-clavulanate (AUGMENTIN) 875-125 MG tablet Take 1 tablet by mouth every 12 (twelve) hours. 14 tablet Birgitta Uhlir, Britta Mccreedy, MD  ibuprofen (ADVIL) 600 MG tablet Take 1 tablet (600 mg total) by mouth every 6 (six) hours as needed. 30 tablet Zacharias Ridling, Britta Mccreedy, MD     PDMP not reviewed this encounter.   Merrilee Jansky, MD 02/22/20 1036

## 2020-08-18 ENCOUNTER — Encounter (HOSPITAL_COMMUNITY): Payer: Self-pay | Admitting: Emergency Medicine

## 2020-08-18 ENCOUNTER — Other Ambulatory Visit: Payer: Self-pay

## 2020-08-18 ENCOUNTER — Ambulatory Visit (HOSPITAL_COMMUNITY)
Admission: EM | Admit: 2020-08-18 | Discharge: 2020-08-18 | Disposition: A | Discharge status: Home / Self Care | Payer: BC Managed Care – PPO | Attending: Family Medicine | Admitting: Family Medicine

## 2020-08-18 DIAGNOSIS — R109 Unspecified abdominal pain: Secondary | ICD-10-CM | POA: Diagnosis not present

## 2020-08-18 DIAGNOSIS — N390 Urinary tract infection, site not specified: Secondary | ICD-10-CM | POA: Diagnosis not present

## 2020-08-18 LAB — POCT URINALYSIS DIPSTICK, ED / UC
Bilirubin Urine: NEGATIVE
Glucose, UA: NEGATIVE mg/dL
Hgb urine dipstick: NEGATIVE
Leukocytes,Ua: NEGATIVE
Nitrite: NEGATIVE
Protein, ur: 30 mg/dL — AB
Specific Gravity, Urine: 1.025 (ref 1.005–1.030)
Urobilinogen, UA: 0.2 mg/dL (ref 0.0–1.0)
pH: 6 (ref 5.0–8.0)

## 2020-08-18 MED ORDER — TAMSULOSIN HCL 0.4 MG PO CAPS: 0.4000 mg | ORAL_CAPSULE | Freq: Every day | ORAL | 0 refills | Status: DC

## 2020-08-18 MED ORDER — KETOROLAC TROMETHAMINE 60 MG/2ML IM SOLN
INTRAMUSCULAR | Status: AC
  Filled 2020-08-18: qty 2

## 2020-08-18 MED ORDER — TRAMADOL HCL 50 MG PO TABS: 50.0000 mg | ORAL_TABLET | Freq: Four times a day (QID) | ORAL | 0 refills | Status: DC | PRN

## 2020-08-18 MED ORDER — KETOROLAC TROMETHAMINE 60 MG/2ML IM SOLN
60.0000 mg | Freq: Once | INTRAMUSCULAR | Status: AC
  Administered 2020-08-18: 60 mg via INTRAMUSCULAR

## 2020-08-18 NOTE — ED Notes (Signed)
Pt cannot void at this time 

## 2020-08-18 NOTE — ED Triage Notes (Signed)
Pt c/o dysuria, lower back pain, and urinary frequency about 2 days ago. Pt states he has been taking ibuprofen for pain.

## 2020-08-18 NOTE — ED Provider Notes (Signed)
[facility]    CSN: [id number] Arrival date & time: 08/18/20  1523      History   Chief Complaint Chief Complaint  Patient presents with  . Urinary Tract Infection    HPI Derek Lawson is a 34 y.o. male.   Patient presenting today with left flank pain x 2 days, sharp and constant in nature, worse with movements. Denies dysuria, hematuria, penile discharge, concern for STIs, abdominal pain, N/V/D, fever. So far has not been trying anything OTC for sxs. Hx of kidney stones, states those felt worse than this but that he is very uncomfortable. No known hx of back injury.      Past Medical History:  Diagnosis Date  . Asthma   . Hypertension     Patient Active Problem List   Diagnosis Date Noted  . Essential hypertension 04/15/2016  . Asthma, mild intermittent 04/15/2016  . Obesity 04/15/2016  . BMI 33.0-33.9,adult 04/15/2016  . Tobacco dependence 04/15/2016    History reviewed. No pertinent surgical history.     Home Medications    Prior to Admission medications   Medication Sig Start Date End Date Taking? Authorizing Provider  amoxicillin-clavulanate (AUGMENTIN) 875-125 MG tablet Take 1 tablet by mouth every 12 (twelve) hours. 02/22/20   Lamptey, Britta Mccreedy, MD  cetirizine (ZYRTEC) 10 MG chewable tablet Chew 10 mg by mouth daily.    [provider]  diphenhydrAMINE (BENADRYL) 25 mg capsule Take 25 mg by mouth every 6 (six) hours as needed for allergies. Reported on 04/15/2016    [provider]  ibuprofen (ADVIL) 600 MG tablet Take 1 tablet (600 mg total) by mouth every 6 (six) hours as needed. 02/22/20   Lamptey, Britta Mccreedy, MD  lisinopril (PRINIVIL,ZESTRIL) 10 MG tablet Take 1 tablet (10 mg total) by mouth daily. 04/15/16   Massie Maroon, FNP  loratadine (CLARITIN) 10 MG tablet Take 10 mg by mouth daily.    [provider]  tamsulosin (FLOMAX) 0.4 MG CAPS capsule Take 1 capsule (0.4 mg total) by mouth daily. 08/18/20   Particia Nearing, PA-C  traMADol (ULTRAM) 50 MG tablet Take 1 tablet (50 mg total) by mouth every 6 (six) hours as needed. 08/18/20   Particia Nearing, PA-C  albuterol (PROVENTIL HFA;VENTOLIN HFA) 108 (90 BASE) MCG/ACT inhaler Inhale 2 puffs into the lungs every 6 (six) hours as needed for wheezing or shortness of breath. 08/10/14 02/22/20  Rodolph Bong, MD  ALBUTEROL IN Inhale 2 puffs into the lungs 4 (four) times daily as needed.   08/10/14  [provider]  atorvastatin (LIPITOR) 20 MG tablet Take 1 tablet (20 mg total) by mouth daily. 04/16/16 02/22/20  Massie Maroon, FNP  montelukast (SINGULAIR) 10 MG tablet Take 1 tablet (10 mg total) by mouth at bedtime. 04/15/16 02/22/20  Massie Maroon, FNP    Family History Family History  Problem Relation Age of Onset  . Diabetes Father     Social History Social History   Tobacco Use  . Smoking status: Current Every Day Smoker    Packs/day: 0.50    Years: 0.50    Pack years: 0.25    Types: Cigarettes    Last attempt to quit: 08/10/2013    Years since quitting: 7.0  . Smokeless tobacco: Never Used  Substance Use Topics  . Alcohol use: Not Currently    Comment: socially  . Drug use: No     Allergies   Charentais melon (french melon)  and Shellfish allergy   Review of Systems Review of Systems PER HPI    Physical Exam Triage Vital Signs ED Triage Vitals [08/18/20 1703]  Enc Vitals Group     BP (!) 153/102     Pulse Rate (!) 105     Resp 16     Temp 99 F (37.2 C)     Temp Source Oral     SpO2 100 %     Weight      Height      Head Circumference      Peak Flow      Pain Score 8     Pain Loc      Pain Edu?      Excl. in GC?    No data found.  Updated Vital Signs BP (!) 153/102 (BP Location: Left Arm)   Pulse (!) 105   Temp 99 F (37.2 C) (Oral)   Resp 16   SpO2 100%   Visual Acuity Right Eye Distance:   Left Eye Distance:   Bilateral Distance:    Right Eye Near:   Left Eye Near:      Bilateral Near:     Physical Exam Vitals and nursing note reviewed.  Constitutional:      Comments: Appears uncomfortable, laying on exam table wincing with movement  HENT:     Head: Atraumatic.  Eyes:     Extraocular Movements: Extraocular movements intact.     Conjunctiva/sclera: Conjunctivae normal.  Cardiovascular:     Rate and Rhythm: Normal rate and regular rhythm.  Pulmonary:     Effort: Pulmonary effort is normal.     Breath sounds: Normal breath sounds.  Abdominal:     General: Bowel sounds are normal. There is no distension.     Palpations: Abdomen is soft.     Tenderness: There is no abdominal tenderness. There is left CVA tenderness. There is no right CVA tenderness or guarding.  Musculoskeletal:        General: Normal range of motion.     Cervical back: Normal range of motion and neck supple.  Skin:    General: Skin is warm and dry.  Neurological:     General: No focal deficit present.     Mental Status: He is oriented to person, place, and time.  Psychiatric:        Mood and Affect: Mood normal.        Thought Content: Thought content normal.        Judgment: Judgment normal.      UC Treatments / Results  Labs (all labs ordered are listed, but only abnormal results are displayed) Labs Reviewed  POCT URINALYSIS DIPSTICK, ED / UC - Abnormal; Notable for the following components:      Result Value   Ketones, ur TRACE (*)    Protein, ur 30 (*)    All other components within normal limits    EKG   Radiology No results found.  Procedures Procedures (including critical care time)  Medications Ordered in UC Medications  ketorolac (TORADOL) injection 60 mg (60 mg Intramuscular Given 08/18/20 1749)    Initial Impression / Assessment and Plan / UC Course  I have reviewed the triage vital signs and the nursing notes.  Pertinent labs & imaging results that were available during my care of the patient were reviewed by me and considered in my medical  decision making (see chart for details).     U/a negative for infection, showing mild  dehydration. He admits to not drinking much fluids today. Suspect kidney stone as cause of his pain. Will tx with flomax, small supply of tramadol for severe pain, and IM toradol was given during visit today. Push fluids. Work note given, return if worsening or not resolving.   Final Clinical Impressions(s) / UC Diagnoses   Final diagnoses:  Left flank pain   Discharge Instructions   None    ED Prescriptions    Medication Sig Dispense Auth. Provider   tamsulosin (FLOMAX) 0.4 MG CAPS capsule Take 1 capsule (0.4 mg total) by mouth daily. 14 capsule Particia Nearing, [place]   traMADol (ULTRAM) 50 MG tablet Take 1 tablet (50 mg total) by mouth every 6 (six) hours as needed. 15 tablet Particia Nearing, [place]     I have reviewed the PDMP during this encounter.   Particia Nearing, [place] 08/18/20 1821

## 2020-10-01 ENCOUNTER — Ambulatory Visit (HOSPITAL_COMMUNITY)
Admission: EM | Admit: 2020-10-01 | Discharge: 2020-10-01 | Disposition: A | Discharge status: Home / Self Care | Payer: BC Managed Care – PPO | Attending: Family Medicine | Admitting: Family Medicine

## 2020-10-01 ENCOUNTER — Other Ambulatory Visit: Payer: Self-pay

## 2020-10-01 ENCOUNTER — Encounter (HOSPITAL_COMMUNITY): Payer: Self-pay

## 2020-10-01 DIAGNOSIS — J4541 Moderate persistent asthma with (acute) exacerbation: Secondary | ICD-10-CM

## 2020-10-01 MED ORDER — PREDNISONE 20 MG PO TABS: 40.0000 mg | ORAL_TABLET | Freq: Every day | ORAL | 0 refills | Status: DC

## 2020-10-01 MED ORDER — ALBUTEROL SULFATE HFA 108 (90 BASE) MCG/ACT IN AERS: 1.0000 | INHALATION_SPRAY | Freq: Four times a day (QID) | RESPIRATORY_TRACT | 1 refills | Status: DC | PRN

## 2020-10-01 NOTE — ED Triage Notes (Signed)
Pt presents with asthma flare up; pt states he is having tightness and congestion X 2 days.

## 2020-10-01 NOTE — ED Provider Notes (Signed)
[facility]   [id number] 10/01/20 Arrival Time: 1149  ASSESSMENT & PLAN:  1. Moderate persistent asthma with exacerbation    No indication for hospital evaluation.  Begin: Meds ordered this encounter  Medications  . albuterol (VENTOLIN HFA) 108 (90 Base) MCG/ACT inhaler    Sig: Inhale 1-2 puffs into the lungs every 6 (six) hours as needed for wheezing or shortness of breath.    Dispense:  1 each    Refill:  1  . predniSONE (DELTASONE) 20 MG tablet    Sig: Take 2 tablets (40 mg total) by mouth daily.    Dispense:  10 tablet    Refill:  0    Asthma precautions given. OTC symptom care as needed.  Recommend:  Follow-up Information    [facility].   Specialty: Urgent Care Why: If worsening or failing to improve as anticipated. Contact information: [address] [place] [address] [phone]              Reviewed expectations re: course of current medical issues. Questions answered. Outlined signs and symptoms indicating need for more acute intervention. Patient verbalized understanding. After Visit Summary given.  SUBJECTIVE: History from: patient.  Derek Lawson is a 34 y.o. male who presents with complaint of fairly persistent chest tightness and wheezing. Onset gradual, over the past couple of days. Triggers: none identified. Describes wheezing as moderate when present. Fever: no. Overall normal PO intake without n/v. Sick contacts: no. Ambulatory without difficulty. No LE edema. Typically his asthma is controlled. Inhaler use: more frequent; now out. OTC treatment: none.  Immunization History  Administered Date(s) Administered  . PFIZER SARS-COV-2 Vaccination 01/26/2020, 02/19/2020  . Pneumococcal Polysaccharide-23 04/15/2016  . Tdap 06/21/2019    Social History   Tobacco Use  Smoking Status Current Every Day Smoker  . Packs/day: 0.50  . Years: 0.50  . Pack years: 0.25  . Types:  Cigarettes  . Last attempt to quit: 08/10/2013  . Years since quitting: 7.1  Smokeless Tobacco Never Used      OBJECTIVE:  Vitals:   10/01/20 1300  BP: (!) 140/104  Pulse: 68  Resp: 17  Temp: 98 F (36.7 C)  TempSrc: Oral  SpO2: 96%     General appearance: alert; NAD HEENT: Pump Back; AT; with mild nasal congestion Neck: supple without LAD Cv: RRR without murmer Lungs: unlabored respirations, moderate bilateral expiratory wheezing; cough: mild; no significant respiratory distress Skin: warm and dry Psychological: alert and cooperative; normal mood and affect    Allergies  Allergen Reactions  . Charentais Melon (French Melon) Hives    Honeydew melon   . Shellfish Allergy Hives    Past Medical History:  Diagnosis Date  . Asthma   . Hypertension    Family History  Problem Relation Age of Onset  . Diabetes Father    Social History   Socioeconomic History  . Marital status: Married    Spouse name: Not on file  . Number of children: Not on file  . Years of education: Not on file  . Highest education level: Not on file  Occupational History  . Not on file  Tobacco Use  . Smoking status: Current Every Day Smoker    Packs/day: 0.50    Years: 0.50    Pack years: 0.25    Types: Cigarettes    Last attempt to quit: 08/10/2013    Years since quitting: 7.1  . Smokeless tobacco: Never Used  Substance  and Sexual Activity  . Alcohol use: Not Currently    Comment: socially  . Drug use: No  . Sexual activity: Not on file  Other Topics Concern  . Not on file  Social History Narrative  . Not on file   Social Determinants of Health   Financial Resource Strain: Not on file  Food Insecurity: Not on file  Transportation Needs: Not on file  Physical Activity: Not on file  Stress: Not on file  Social Connections: Not on file  Intimate Partner Violence: Not on file            Mardella Layman, MD 10/01/20 1535

## 2021-02-02 ENCOUNTER — Other Ambulatory Visit: Payer: Self-pay

## 2021-02-02 ENCOUNTER — Observation Stay (HOSPITAL_BASED_OUTPATIENT_CLINIC_OR_DEPARTMENT_OTHER)
Admission: EM | Admit: 2021-02-02 | Discharge: 2021-02-04 | Disposition: A | Discharge status: Home / Self Care | Payer: BC Managed Care – PPO | Source: Home / Self Care | Attending: Emergency Medicine | Admitting: Emergency Medicine

## 2021-02-02 ENCOUNTER — Encounter (HOSPITAL_COMMUNITY): Payer: Self-pay

## 2021-02-02 ENCOUNTER — Emergency Department (HOSPITAL_COMMUNITY): Payer: BC Managed Care – PPO

## 2021-02-02 DIAGNOSIS — K922 Gastrointestinal hemorrhage, unspecified: Secondary | ICD-10-CM | POA: Diagnosis not present

## 2021-02-02 DIAGNOSIS — J45909 Unspecified asthma, uncomplicated: Secondary | ICD-10-CM | POA: Insufficient documentation

## 2021-02-02 DIAGNOSIS — J309 Allergic rhinitis, unspecified: Secondary | ICD-10-CM | POA: Diagnosis not present

## 2021-02-02 DIAGNOSIS — K449 Diaphragmatic hernia without obstruction or gangrene: Secondary | ICD-10-CM | POA: Insufficient documentation

## 2021-02-02 DIAGNOSIS — I10 Essential (primary) hypertension: Secondary | ICD-10-CM

## 2021-02-02 DIAGNOSIS — R55 Syncope and collapse: Secondary | ICD-10-CM | POA: Diagnosis not present

## 2021-02-02 DIAGNOSIS — M25572 Pain in left ankle and joints of left foot: Secondary | ICD-10-CM

## 2021-02-02 DIAGNOSIS — Z20822 Contact with and (suspected) exposure to covid-19: Secondary | ICD-10-CM | POA: Insufficient documentation

## 2021-02-02 DIAGNOSIS — D62 Acute posthemorrhagic anemia: Secondary | ICD-10-CM

## 2021-02-02 DIAGNOSIS — Z79899 Other long term (current) drug therapy: Secondary | ICD-10-CM | POA: Insufficient documentation

## 2021-02-02 DIAGNOSIS — F17210 Nicotine dependence, cigarettes, uncomplicated: Secondary | ICD-10-CM | POA: Insufficient documentation

## 2021-02-02 DIAGNOSIS — J4520 Mild intermittent asthma, uncomplicated: Secondary | ICD-10-CM

## 2021-02-02 DIAGNOSIS — K2970 Gastritis, unspecified, without bleeding: Secondary | ICD-10-CM | POA: Insufficient documentation

## 2021-02-02 DIAGNOSIS — K254 Chronic or unspecified gastric ulcer with hemorrhage: Secondary | ICD-10-CM | POA: Diagnosis not present

## 2021-02-02 DIAGNOSIS — R42 Dizziness and giddiness: Secondary | ICD-10-CM

## 2021-02-02 LAB — COMPREHENSIVE METABOLIC PANEL
ALT: 71 U/L — ABNORMAL HIGH (ref 0–44)
AST: 25 U/L (ref 15–41)
Albumin: 4.2 g/dL (ref 3.5–5.0)
Alkaline Phosphatase: 63 U/L (ref 38–126)
Anion gap: 9 (ref 5–15)
BUN: 24 mg/dL — ABNORMAL HIGH (ref 6–20)
CO2: 27 mmol/L (ref 22–32)
Calcium: 9.5 mg/dL (ref 8.9–10.3)
Chloride: 101 mmol/L (ref 98–111)
Creatinine, Ser: 0.95 mg/dL (ref 0.61–1.24)
GFR, Estimated: >60 mL/min (ref >60)
Glucose, Bld: 295 mg/dL — ABNORMAL HIGH (ref 70–99)
Potassium: 4.8 mmol/L (ref 3.5–5.1)
Sodium: 137 mmol/L (ref 135–145)
Total Bilirubin: 0.7 mg/dL (ref 0.3–1.2)
Total Protein: 7.4 g/dL (ref 6.5–8.1)

## 2021-02-02 LAB — CBC WITH DIFFERENTIAL/PLATELET
Abs Immature Granulocytes: 0.03 10*3/uL (ref 0.00–0.07)
Basophils Absolute: 0 10*3/uL (ref 0.0–0.1)
Basophils Relative: 0 %
Eosinophils Absolute: 0.1 10*3/uL (ref 0.0–0.5)
Eosinophils Relative: 1 %
HCT: 36.6 % — ABNORMAL LOW (ref 39.0–52.0)
Hemoglobin: 12.4 g/dL — ABNORMAL LOW (ref 13.0–17.0)
Immature Granulocytes: 0 %
Lymphocytes Relative: 21 %
Lymphs Abs: 1.9 10*3/uL (ref 0.7–4.0)
MCH: 28.9 pg (ref 26.0–34.0)
MCHC: 33.9 g/dL (ref 30.0–36.0)
MCV: 85.3 fL (ref 80.0–100.0)
Monocytes Absolute: 0.6 10*3/uL (ref 0.1–1.0)
Monocytes Relative: 6 %
Neutro Abs: 6.3 10*3/uL (ref 1.7–7.7)
Neutrophils Relative %: 72 %
Platelets: 233 10*3/uL (ref 150–400)
RBC: 4.29 MIL/uL (ref 4.22–5.81)
RDW: 12.4 % (ref 11.5–15.5)
WBC: 8.9 10*3/uL (ref 4.0–10.5)
nRBC: 0 % (ref 0.0–0.2)

## 2021-02-02 LAB — PROTIME-INR
INR: 1 (ref 0.8–1.2)
Prothrombin Time: 13.1 seconds (ref 11.4–15.2)

## 2021-02-02 LAB — RESP PANEL BY RT-PCR (FLU A&B, COVID) ARPGX2
Influenza A by PCR: NEGATIVE
Influenza B by PCR: NEGATIVE
SARS Coronavirus 2 by RT PCR: NEGATIVE

## 2021-02-02 LAB — HEMOGLOBIN AND HEMATOCRIT, BLOOD
HCT: 36.8 % — ABNORMAL LOW (ref 39.0–52.0)
Hemoglobin: 12.6 g/dL — ABNORMAL LOW (ref 13.0–17.0)

## 2021-02-02 LAB — POC OCCULT BLOOD, ED: Fecal Occult Bld: POSITIVE — AB

## 2021-02-02 MED ORDER — ALBUTEROL SULFATE HFA 108 (90 BASE) MCG/ACT IN AERS
1.0000 | INHALATION_SPRAY | Freq: Four times a day (QID) | RESPIRATORY_TRACT | Status: DC | PRN
  Administered 2021-02-03: 2 via RESPIRATORY_TRACT
  Filled 2021-02-02: qty 6.7

## 2021-02-02 MED ORDER — SODIUM CHLORIDE 0.9 % IV SOLN: INTRAVENOUS | Status: DC

## 2021-02-02 MED ORDER — SODIUM CHLORIDE 0.9 % IV SOLN
8.0000 mg/h | INTRAVENOUS | Status: DC
  Administered 2021-02-02 – 2021-02-03 (×3): 8 mg/h via INTRAVENOUS
  Filled 2021-02-02 (×3): qty 80

## 2021-02-02 MED ORDER — PANTOPRAZOLE SODIUM 40 MG IV SOLR
40.0000 mg | Freq: Once | INTRAVENOUS | Status: AC
  Administered 2021-02-02: 40 mg via INTRAVENOUS
  Filled 2021-02-02: qty 40

## 2021-02-02 NOTE — ED Provider Notes (Signed)
[facility]-EMERGENCY DEPT Provider Note   CSN: [id number] Arrival date & time: 02/02/21  1546     History Chief Complaint  Patient presents with  . Melena  . Dizziness    Derek Lawson is a 35 y.o. male.  HPI   Pt states he started noticing the sx two days ago.  He started having dark stools and became dizzy.  He noticed it multiple times per day.  He was feeling dizzy after he would use the bathroom.   No vomiting.  He noticed when he was getting short of breath when he tried to get up and walk around.   Decent nsaid use in the last several days but did take some last month when he had his tooth pulled.  Past Medical History:  Diagnosis Date  . Asthma   . Hypertension     Patient Active Problem List   Diagnosis Date Noted  . Essential hypertension 04/15/2016  . Asthma, mild intermittent 04/15/2016  . Obesity 04/15/2016  . BMI 33.0-33.9,adult 04/15/2016  . Tobacco dependence 04/15/2016    History reviewed. No pertinent surgical history.     Family History  Problem Relation Age of Onset  . Diabetes Father     Social History   Tobacco Use  . Smoking status: Current Every Day Smoker    Packs/day: 0.50    Years: 0.50    Pack years: 0.25    Types: Cigarettes    Last attempt to quit: 08/10/2013    Years since quitting: 7.4  . Smokeless tobacco: Never Used  Substance Use Topics  . Alcohol use: Not Currently    Comment: socially  . Drug use: No    Home Medications Prior to Admission medications   Medication Sig Start Date End Date Taking? Authorizing Provider  albuterol (VENTOLIN HFA) 108 (90 Base) MCG/ACT inhaler Inhale 1-2 puffs into the lungs every 6 (six) hours as needed for wheezing or shortness of breath. 10/01/20   Mardella Layman, MD  cetirizine (ZYRTEC) 10 MG chewable tablet Chew 10 mg by mouth daily.    [provider]  diphenhydrAMINE (BENADRYL) 25 mg capsule Take 25 mg by mouth every 6 (six) hours as needed for  allergies. Reported on 04/15/2016    [provider]  ibuprofen (ADVIL) 600 MG tablet Take 1 tablet (600 mg total) by mouth every 6 (six) hours as needed. 02/22/20   Lamptey, Britta Mccreedy, MD  lisinopril (PRINIVIL,ZESTRIL) 10 MG tablet Take 1 tablet (10 mg total) by mouth daily. 04/15/16   Massie Maroon, FNP  loratadine (CLARITIN) 10 MG tablet Take 10 mg by mouth daily.    [provider]  predniSONE (DELTASONE) 20 MG tablet Take 2 tablets (40 mg total) by mouth daily. 10/01/20   Mardella Layman, MD  tamsulosin (FLOMAX) 0.4 MG CAPS capsule Take 1 capsule (0.4 mg total) by mouth daily. 08/18/20   Particia Nearing, PA-C  traMADol (ULTRAM) 50 MG tablet Take 1 tablet (50 mg total) by mouth every 6 (six) hours as needed. 08/18/20   Particia Nearing, PA-C  ALBUTEROL IN Inhale 2 puffs into the lungs 4 (four) times daily as needed.   08/10/14  [provider]  atorvastatin (LIPITOR) 20 MG tablet Take 1 tablet (20 mg total) by mouth daily. 04/16/16 02/22/20  Massie Maroon, FNP  montelukast (SINGULAIR) 10 MG tablet Take 1 tablet (10 mg total) by mouth at bedtime. 04/15/16 02/22/20  Massie Maroon, FNP    Allergies  Charentais melon (french melon) and Shellfish allergy  Review of Systems   Review of Systems  All other systems reviewed and are negative.   Physical Exam Updated Vital Signs BP (!) 123/97   Pulse (!) 102   Temp 97.7 F (36.5 C) (Oral)   Resp 14   Ht 1.778 m (5\' 10" )   Wt 104.3 kg   SpO2 99%   BMI 33.00 kg/m   Physical Exam Vitals and nursing note reviewed.  Constitutional:      General: He is not in acute distress.    Appearance: He is well-developed.  HENT:     Head: Normocephalic and atraumatic.     Right Ear: External ear normal.     Left Ear: External ear normal.  Eyes:     General: No scleral icterus.       Right eye: No discharge.        Left eye: No discharge.     Conjunctiva/sclera: Conjunctivae normal.  Neck:     Trachea: No  tracheal deviation.  Cardiovascular:     Rate and Rhythm: Regular rhythm. Tachycardia present.  Pulmonary:     Effort: Pulmonary effort is normal. No respiratory distress.     Breath sounds: Normal breath sounds. No stridor. No wheezing or rales.  Abdominal:     General: Bowel sounds are normal. There is no distension.     Palpations: Abdomen is soft.     Tenderness: There is no abdominal tenderness. There is no guarding or rebound.  Genitourinary:    Comments: Dark black stool on rectal exam Musculoskeletal:        General: No swelling, tenderness or deformity.     Cervical back: Neck supple.  Skin:    General: Skin is warm and dry.     Findings: No rash.  Neurological:     Mental Status: He is alert.     Cranial Nerves: Cranial nerve deficit: no gross deficits.     Sensory: No sensory deficit.     Motor: No abnormal muscle tone or seizure activity.     Coordination: Coordination normal.     ED Results / Procedures / Treatments   Labs (all labs ordered are listed, but only abnormal results are displayed) Labs Reviewed  COMPREHENSIVE METABOLIC PANEL - Abnormal; Notable for the following components:      Result Value   Glucose, Bld 295 (*)    BUN 24 (*)    ALT 71 (*)    All other components within normal limits  CBC WITH DIFFERENTIAL/PLATELET - Abnormal; Notable for the following components:   Hemoglobin 12.4 (*)    HCT 36.6 (*)    All other components within normal limits  POC OCCULT BLOOD, ED - Abnormal; Notable for the following components:   Fecal Occult Bld POSITIVE (*)    All other components within normal limits  PROTIME-INR  TYPE AND SCREEN    EKG EKG Interpretation  Date/Time:  Monday February 02 2021 16:08:20 EDT Ventricular Rate:  108 PR Interval:  131 QRS Duration: 91 QT Interval:  311 QTC Calculation: 417 R Axis:   137 Text Interpretation: Right and left arm electrode reversal, interpretation assumes no reversal Sinus tachycardia No old tracing to  compare Confirmed by 01-16-2003 [phone]) on 02/02/2021 4:18:48 PM   Radiology DG Chest 2 View  Result Date: 02/02/2021 CLINICAL DATA:  Shortness of breath EXAM: CHEST - 2 VIEW COMPARISON:  04/05/2015 FINDINGS: The heart size and mediastinal contours are within normal  limits. Both lungs are clear. The visualized skeletal structures are unremarkable. IMPRESSION: No active cardiopulmonary disease. Electronically Signed   By: Alcide Clever M.D.   On: 02/02/2021 16:37    Procedures Procedures   Medications Ordered in ED Medications  pantoprazole (PROTONIX) injection 40 mg (40 mg Intravenous Given 02/02/21 1730)    ED Course  I have reviewed the triage vital signs and the nursing notes.  Pertinent labs & imaging results that were available during my care of the patient were reviewed by me and considered in my medical decision making (see chart for details).  Clinical Course as of 02/02/21 1853  Mon Feb 02, 2021  1747 Hemoccult is positive.  Hemoglobin is 12 [JK]  1852 Case discussed with Dr Georgiann Cocker.  Will see pt in consultation. [JK]    Clinical Course User Index [JK] Linwood Dibbles, MD   MDM Rules/Calculators/A&P                          Patient presented to the ED for evaluation of black tarry stools as well as weakness and shortness of breath.  Patient is having symptoms primarily when he is up and trying to walk around.  Patient does have dark black stools on rectal exam.  He was guaiac positive.  Fortunately his hemoglobin is normal and does not require blood transfusion.  Patient is breathing easily in the ED and has normal oxygen saturation.  Chest x-ray does not show evidence of pneumonia.  I doubt pulm embolism.  Suspect patient is having symptoms likely to be an upper GI bleed.  He has taken NSAIDs in the past.  I will consult with GI medical service for admission and further evaluation. Final Clinical Impression(s) / ED Diagnoses Final diagnoses:  Gastrointestinal hemorrhage,  unspecified gastrointestinal hemorrhage type    Rx / DC Orders ED Discharge Orders    None       Linwood Dibbles, MD 02/02/21 1818

## 2021-02-02 NOTE — ED Notes (Signed)
Pt couldn't stand up to complete orthostatic vitals pt states feels weak

## 2021-02-02 NOTE — ED Notes (Signed)
ED TO INPATIENT HANDOFF REPORT  ED Nurse Name and Phone #: [phone], Charise Carwin, RN   S Name/Age/Gender Derek Lawson 35 y.o. male Room/Bed: WA06/WA06  Code Status   Code Status: Full Code  Home/SNF/Other Home Patient oriented to: self, place, time and situation Is this baseline? Yes   Triage Complete: Triage complete  Chief Complaint Acute upper GI bleed [K92.2]  Triage Note Pt BIB EMS from [facility] : [facility]. Pt c/o black tarry stools, states he has had 3 since last night. Pt c/o dizziness since this AM. EMS reports pt was slightly diaphoretic upon their arrival. Pt denies diabetes.  20G L AC 100 mL NS given by EMS CBG 312 152/78 110 HR 98% RA    Allergies Allergies  Allergen Reactions  . Charentais Melon (French Melon) Hives    Honeydew melon   . Shellfish Allergy Hives  . Sulfa Antibiotics Other (See Comments)    unknown    Level of Care/Admitting Diagnosis ED Disposition    ED Disposition Condition Comment   Admit  Hospital Area: [facility] [[id number]]  Level of Care: Progressive [102]  Admit to Progressive based on following criteria: GI, ENDOCRINE disease patients with GI bleeding, acute liver failure or pancreatitis, stable with diabetic ketoacidosis or thyrotoxicosis (hypothyroid) state.  Covid Evaluation: Asymptomatic Screening Protocol (No Symptoms)  Diagnosis: Acute upper GI bleed [109323]  Admitting Physician: Angie Fava [[id number]]  Attending Physician: Angie Fava [[id number]]       B Medical/Surgery History Past Medical History:  Diagnosis Date  . Asthma   . Hypertension    History reviewed. No pertinent surgical history.   A IV Location/Drains/Wounds Patient Lines/Drains/Airways Status    Active Line/Drains/Airways    Name Placement date Placement time Site Days   Peripheral IV 02/02/21 Left Forearm 02/02/21  1718  Forearm  less than 1   Peripheral IV 02/02/21 Right Antecubital 02/02/21   1745  Antecubital  less than 1          Intake/Output Last 24 hours No intake or output data in the 24 hours ending 02/02/21 2001  Labs/Imaging Results for orders placed or performed during the hospital encounter of 02/02/21 (from the past 48 hour(s))  POC occult blood, ED Provider will collect     Status: Abnormal   Collection Time: 02/02/21  4:45 PM  Result Value Ref Range   Fecal Occult Bld POSITIVE (A) NEGATIVE  Comprehensive metabolic panel     Status: Abnormal   Collection Time: 02/02/21  5:25 PM  Result Value Ref Range   Sodium 137 135 - 145 mmol/L   Potassium 4.8 3.5 - 5.1 mmol/L   Chloride 101 98 - 111 mmol/L   CO2 27 22 - 32 mmol/L   Glucose, Bld 295 (H) 70 - 99 mg/dL    Comment: Glucose reference range applies only to samples taken after fasting for at least 8 hours.   BUN 24 (H) 6 - 20 mg/dL   Creatinine, Ser 2.37 0.61 - 1.24 mg/dL   Calcium 9.5 8.9 - 62.8 mg/dL   Total Protein 7.4 6.5 - 8.1 g/dL   Albumin 4.2 3.5 - 5.0 g/dL   AST 25 15 - 41 U/L   ALT 71 (H) 0 - 44 U/L   Alkaline Phosphatase 63 38 - 126 U/L   Total Bilirubin 0.7 0.3 - 1.2 mg/dL   GFR, Estimated >31 >51 mL/min    Comment: (NOTE) Calculated using the CKD-EPI Creatinine Equation (2021)  Anion gap 9 5 - 15    Comment: Performed at [facility], [address] [place], [place] [address]  CBC WITH DIFFERENTIAL     Status: Abnormal   Collection Time: 02/02/21  5:25 PM  Result Value Ref Range   WBC 8.9 4.0 - 10.5 K/uL   RBC 4.29 4.22 - 5.81 MIL/uL   Hemoglobin 12.4 (L) 13.0 - 17.0 g/dL   HCT 22.2 (L) 97.9 - 89.2 %   MCV 85.3 80.0 - 100.0 fL   MCH 28.9 26.0 - 34.0 pg   MCHC 33.9 30.0 - 36.0 g/dL   RDW 11.9 41.7 - 40.8 %   Platelets 233 150 - 400 K/uL   nRBC 0.0 0.0 - 0.2 %   Neutrophils Relative % 72 %   Neutro Abs 6.3 1.7 - 7.7 K/uL   Lymphocytes Relative 21 %   Lymphs Abs 1.9 0.7 - 4.0 K/uL   Monocytes Relative 6 %   Monocytes Absolute 0.6 0.1 - 1.0 K/uL    Eosinophils Relative 1 %   Eosinophils Absolute 0.1 0.0 - 0.5 K/uL   Basophils Relative 0 %   Basophils Absolute 0.0 0.0 - 0.1 K/uL   Immature Granulocytes 0 %   Abs Immature Granulocytes 0.03 0.00 - 0.07 K/uL    Comment: Performed at [facility], [address] [place], [place] [address]  Protime-INR     Status: None   Collection Time: 02/02/21  5:25 PM  Result Value Ref Range   Prothrombin Time 13.1 11.4 - 15.2 seconds   INR 1.0 0.8 - 1.2    Comment: (NOTE) INR goal varies based on device and disease states. Performed at [facility], [address], [place], [place] [address]   Type and screen [facility]     Status: None   Collection Time: 02/02/21  5:25 PM  Result Value Ref Range   ABO/RH(D) O POS    Antibody Screen NEG    Sample Expiration      02/05/2021,2359 Performed at [facility], [address] [place], [place] [address]    DG Chest 2 View  Result Date: 02/02/2021 CLINICAL DATA:  Shortness of breath EXAM: CHEST - 2 VIEW COMPARISON:  04/05/2015 FINDINGS: The heart size and mediastinal contours are within normal limits. Both lungs are clear. The visualized skeletal structures are unremarkable. IMPRESSION: No active cardiopulmonary disease. Electronically Signed   By: Alcide Clever M.D.   On: 02/02/2021 16:37    Pending Labs Unresulted Labs (From admission, onward)          Start     Ordered   02/03/21 0900  Hemoglobin and hematocrit, blood  Once-Timed,   R        02/02/21 1855   02/03/21 0500  HIV Antibody (routine testing w rflx)  (HIV Antibody (Routine testing w reflex) panel)  Tomorrow morning,   R        02/02/21 1853   02/03/21 0100  Hemoglobin and hematocrit, blood  Once-Timed,   STAT        02/02/21 1855   02/02/21 2100  Hemoglobin and hematocrit, blood  Once-Timed,   STAT        02/02/21 1855   02/02/21 2100  ABO/Rh  Once,   R        02/02/21 2100   02/02/21 1848  Resp Panel  by RT-PCR (Flu A&B, Covid) Nasopharyngeal Swab  (Tier 2 - Symptomatic/asymptomatic with Precautions )  ONCE - STAT,   STAT       Question Answer Comment  Is this test for diagnosis or screening Screening   Symptomatic for COVID-19 as defined by CDC No   Hospitalized for COVID-19 Unknown   Admitted to ICU for COVID-19 No   Previously tested for COVID-19 Unknown   Resident in a congregate (group) care setting No   Employed in healthcare setting Unknown   Has patient completed COVID vaccination(s) (2 doses of Pfizer/Moderna 1 dose of [employer]) Unknown      02/02/21 1847          Vitals/Pain Today's Vitals   02/02/21 1815 02/02/21 1830 02/02/21 1845 02/02/21 1900  BP: (!) 130/92 (!) 137/100 131/85 (!) 123/98  Pulse: 97 (!) 105 (!) 107 (!) 102  Resp: 15 18 17 14   Temp:      TempSrc:      SpO2: 98% 97% 99% 99%  Weight:      Height:        Isolation Precautions No active isolations  Medications Medications  pantoprazole (PROTONIX) 80 mg in sodium chloride 0.9 % 100 mL (0.8 mg/mL) infusion (has no administration in time range)  pantoprazole (PROTONIX) injection 40 mg (40 mg Intravenous Given 02/02/21 1730)    Mobility walks Low fall risk   Focused Assessments    R Recommendations: See Admitting Provider Note  Report given to:   Additional Notes:

## 2021-02-02 NOTE — H&P (Signed)
History and Physical    PLEASE NOTE THAT DRAGON DICTATION SOFTWARE WAS USED IN THE CONSTRUCTION OF THIS NOTE.   Derek Lawson ZOX:[id number] DOB: [birth year] DOA: 02/02/2021  PCP: Patient, No Pcp Per (Inactive) Patient coming from: home   I have personally briefly reviewed patient's old medical records in [facility]  Chief Complaint: dizziness  HPI: Derek Lawson is a 35 y.o. male with medical history significant for HTN, asthma, allergic rhinitis, who is admitted to [facility] on 02/02/2021 with suspected acute upper GI bleed after presenting from home to [facility] ED complaining of dizziness.   Patient reports 2 days of intermittent dizziness and lightheadedness, worse when rising from a seated to standing position.  Not associate with any chest pain, palpitations, diaphoresis, nausea, vomiting, or syncope.  Over that timeframe, he has noted 3-4 episodes of dark-colored stool, which he reports is well formed and absent of melena.  Denies any associated hematemesis, abdominal pain, rash, or recent trauma.  Not associate with any subjective fever, chills, rigors, or generalized myalgias. Reports associated mild sob, but denies any recent orthopnea, PND, peripheral edema, calf tenderness, or lower extremity erythema. No hemoptysis. Not associated with dysuria, gross hematuria, or change urinary urgency/frequency. Denies any associated cough or hemoptysis.   No prior history of suspected gastrointestinal bleed, and denies any previously evaluation with EGD or colonscopy. The patient denies any regular or recent consumption of alcohol, and denies any known history of underlying chronic liver disease. Denies any use of blood thinners as an outpatient, including no aspirin.  Briefly took as needed ibuprofen approximately 1 month ago in the setting of tooth extraction.  Reports that the total duration of this.  Use of ibuprofen lasted less than 1 week, and concluded greater than 3  weeks ago.  Subsequently, denies any residual ibuprofen use or any other use of NSAIDs. Also denies taking any of the following oral medications at home: systemic corticosteroids, supplemental potassium, supplemental iron, or bisphosphonate medications. Denies any personal history of malignancy, and denies any recent unintentional weight loss, dysphagia, or early satiety.  Per chart review, it appears that patient's only previous hemoglobin 8 point occurred in 2017, at which time hemoglobin noted to be 16.4.   No recent traveling or known COVID-19 exposures.     ED Course:  Vital signs in the ED were notable for the following: Tetramex 97.7; heart rate 10 4-1 06; blood pressure 123/92 -133/99; respiratory rate 14-18; oxygen saturation 98 to 100% on room air.  Labs were notable for the following: CMP was notable for the following: Bicarbonate 27, anion gap 9, BUN 24 relative to most recent prior value of 04 April 2016, creatinine 0.95.  CBC notable for white blood cell count of 8900, hemoglobin 12.4 with normocytic, normochromic findings as well as RDW 12.4, platelets 233.  INR 1.0. Rectal exam in ED revealed Hemoccult positive stool.  Screening nasopharyngeal COVID-19 PCR was performed in the ED, and found to be negative. Type and screen completed in the ED  EKG showed sinus tachycardia with heart rate 108, normal intervals, non-specific T wave inversion in leads I and aVL, with no prior EKG available for point comparison, and showed no evidence of ST changes, including no evidence of ST elevation.   On-call GI was consulted by the ED physician, with formal recs pending at this time.   While in the ED, the following were administered: Protonix 40 mg IV x 1.     Review of Systems:  As per HPI otherwise 10 point review of systems negative.   Past Medical History:  Diagnosis Date  . Asthma   . Hypertension     History reviewed. No pertinent surgical history.  Social History:  reports that  he has been smoking cigarettes. He has a 0.25 pack-year smoking history. He has never used smokeless tobacco. He reports previous alcohol use. He reports that he does not use drugs.   Allergies  Allergen Reactions  . Charentais Melon (French Melon) Hives    Honeydew melon   . Shellfish Allergy Hives    Family History  Problem Relation Age of Onset  . Diabetes Father      Prior to Admission medications   Medication Sig Start Date End Date Taking? Authorizing Provider  albuterol (VENTOLIN HFA) 108 (90 Base) MCG/ACT inhaler Inhale 1-2 puffs into the lungs every 6 (six) hours as needed for wheezing or shortness of breath. 10/01/20   Mardella Layman, MD  cetirizine (ZYRTEC) 10 MG chewable tablet Chew 10 mg by mouth daily.    [provider]  diphenhydrAMINE (BENADRYL) 25 mg capsule Take 25 mg by mouth every 6 (six) hours as needed for allergies. Reported on 04/15/2016    [provider]  ibuprofen (ADVIL) 600 MG tablet Take 1 tablet (600 mg total) by mouth every 6 (six) hours as needed. 02/22/20   Lamptey, Britta Mccreedy, MD  lisinopril (PRINIVIL,ZESTRIL) 10 MG tablet Take 1 tablet (10 mg total) by mouth daily. 04/15/16   Massie Maroon, FNP  loratadine (CLARITIN) 10 MG tablet Take 10 mg by mouth daily.    [provider]  predniSONE (DELTASONE) 20 MG tablet Take 2 tablets (40 mg total) by mouth daily. 10/01/20   Mardella Layman, MD  tamsulosin (FLOMAX) 0.4 MG CAPS capsule Take 1 capsule (0.4 mg total) by mouth daily. 08/18/20   Particia Nearing, PA-C  traMADol (ULTRAM) 50 MG tablet Take 1 tablet (50 mg total) by mouth every 6 (six) hours as needed. 08/18/20   Particia Nearing, PA-C  ALBUTEROL IN Inhale 2 puffs into the lungs 4 (four) times daily as needed.   08/10/14  [provider]  atorvastatin (LIPITOR) 20 MG tablet Take 1 tablet (20 mg total) by mouth daily. 04/16/16 02/22/20  Massie Maroon, FNP  montelukast (SINGULAIR) 10 MG tablet Take 1 tablet (10  mg total) by mouth at bedtime. 04/15/16 02/22/20  Massie Maroon, FNP     Objective    Physical Exam: Vitals:   02/02/21 1800 02/02/21 1815 02/02/21 1830 02/02/21 1845  BP: (!) 128/95 (!) 130/92 (!) 137/100   Pulse: 97 97 (!) 105 (!) 107  Resp: 17 15 18 17   Temp:      TempSrc:      SpO2: 98% 98% 97% 99%  Weight:      Height:        General: appears to be stated age; alert, oriented Skin: warm, dry, no rash Head:  AT/[place] Mouth:  Oral mucosa membranes appear dry, normal dentition Neck: supple; trachea midline Heart:  Tachycardic, but regular; did not appreciate any M/R/G Lungs: CTAB, did not appreciate any wheezes, rales, or rhonchi Abdomen: + BS; soft, ND, NT Vascular: 2+ pedal pulses b/l; 2+ radial pulses b/l Extremities: no peripheral edema, no muscle wasting Neuro: strength and sensation intact in upper and lower extremities b/l    Labs on Admission: I have personally reviewed following labs and imaging studies  CBC: Recent Labs  Lab 02/02/21 1725  WBC 8.9  NEUTROABS 6.3  HGB 12.4*  HCT 36.6*  MCV 85.3  PLT 233   Basic Metabolic Panel: Recent Labs  Lab 02/02/21 1725  NA 137  K 4.8  CL 101  CO2 27  GLUCOSE 295*  BUN 24*  CREATININE 0.95  CALCIUM 9.5   GFR: Estimated Creatinine Clearance: 130 mL/min (by C-G formula based on SCr of 0.95 mg/dL). Liver Function Tests: Recent Labs  Lab 02/02/21 1725  AST 25  ALT 71*  ALKPHOS 63  BILITOT 0.7  PROT 7.4  ALBUMIN 4.2   No results for input(s): LIPASE, AMYLASE in the last 168 hours. No results for input(s): AMMONIA in the last 168 hours. Coagulation Profile: Recent Labs  Lab 02/02/21 1725  INR 1.0   Cardiac Enzymes: No results for input(s): CKTOTAL, CKMB, CKMBINDEX, TROPONINI in the last 168 hours. BNP (last 3 results) No results for input(s): PROBNP in the last 8760 hours. HbA1C: No results for input(s): HGBA1C in the last 72 hours. CBG: No results for input(s): GLUCAP in the last 168  hours. Lipid Profile: No results for input(s): CHOL, HDL, LDLCALC, TRIG, CHOLHDL, LDLDIRECT in the last 72 hours. Thyroid Function Tests: No results for input(s): TSH, T4TOTAL, FREET4, T3FREE, THYROIDAB in the last 72 hours. Anemia Panel: No results for input(s): VITAMINB12, FOLATE, FERRITIN, TIBC, IRON, RETICCTPCT in the last 72 hours. Urine analysis:    Component Value Date/Time   COLORURINE YELLOW 09/08/2015 1136   APPEARANCEUR CLEAR 09/08/2015 1136   LABSPEC 1.025 08/18/2020 1748   PHURINE 6.0 08/18/2020 1748   GLUCOSEU NEGATIVE 08/18/2020 1748   HGBUR NEGATIVE 08/18/2020 1748   BILIRUBINUR NEGATIVE 08/18/2020 1748   KETONESUR TRACE (A) 08/18/2020 1748   PROTEINUR 30 (A) 08/18/2020 1748   UROBILINOGEN 0.2 08/18/2020 1748   NITRITE NEGATIVE 08/18/2020 1748   LEUKOCYTESUR NEGATIVE 08/18/2020 1748    Radiological Exams on Admission: DG Chest 2 View  Result Date: 02/02/2021 CLINICAL DATA:  Shortness of breath EXAM: CHEST - 2 VIEW COMPARISON:  04/05/2015 FINDINGS: The heart size and mediastinal contours are within normal limits. Both lungs are clear. The visualized skeletal structures are unremarkable. IMPRESSION: No active cardiopulmonary disease. Electronically Signed   By: Alcide Clever M.D.   On: 02/02/2021 16:37     EKG: Independently reviewed, with result as described above.    Assessment/Plan   Jaysion R Schellenberg is a 35 y.o. male with medical history significant for HTN, asthma, allergic rhinitis, who is admitted to [facility] on 02/02/2021 with suspected acute upper GI bleed after presenting from home to [facility] ED complaining of dizziness.    Principal Problem:   Acute upper GI bleed Active Problems:   Essential hypertension   Asthma, mild intermittent   Dizziness   Acute blood loss anemia   Allergic rhinitis      #) Acute Upper GI Bleed: diagnosis on the basis of 2 days of dark colored stool, elevated BUN relative to most recent prior, and rectal exam  in ER a/w guaiac positive stool. Not on any blood thinners as an outpatient, including no aspirin.  On as needed ibuprofen approximately a month ago in the setting of tooth extraction, but denies any use of NSAIDs over the last 2-3. No known history of known underlying liver disease, and denies any history of alcohol abuse or recent alcohol consumption.  Denies any prior history of acute gastrointestinal bleed, denies any prior endoscopic evaluation, including no prior EGD or colonoscopy. DDx includes PUD vs gastritis, Dieulafoy  lesion vs AVM. In the absence of known liver disease, initiation of SBP prophylaxis does not appear to be warranted. Presentation and history are less suggestive of variceal bleed, and therefore there does not appear to be an indication for octreotide. Of note, no associated N/V or hematemesis. INR 1.0.   At this time, the patient is mildly tachycardic, but stable and overall appears hemodynamically stable at this time. Presentation appears to be associated with acute blood loss anemia, as further described below. Of note, patient was typed and screened in the ED today. Given suspected upper GI source, will initiate Protonix drip. ED physician consulted the on-call GI physician, with ensuing recs pending at this time.       Plan: NPO. Refraining from pharmacologic DVT prophylaxis. Monitor on telemetry. Monitor continuous pulse-ox. Maintain at least 2 large bore IV's. repeat INR in the AM. Q4H H&H's have been ordered through 9 AM tomorrow (02/03/21). Will closely monitor these ensuing Hgb levels and correlate these data points with the patient's overall clinical picture including vital signs to determine need for subsequent transfusion.  Work-up and management of suspected associated acute blood loss anemia, as further described below.  Protonix drip.  GI consult, as above. Gentle overnight IVF's.  Repeat CMP in the morning.       #) Acute blood loss anemia: in the setting of  suspected acute upper GI bleed, presenting Hgb noted to be 12.4, relative to most recent prior value of 16.4 in June 2017, presumably representing acute blood loss anemia, although lack of interval data points complicates the acuity of this evaluation.  Of note, presenting anemia appears to be normocytic, normochromic, and without elevation of RDW. currently asymptomatic.  Typed and screened in the ED this evening, as noted above. INR 1.0.    Plan: work-up and management for presenting suspected acute upper GI bleed, as above, including close monitoring of Q4H H&H's, with clinical evaluation for determination of need for blood transfusion, as further described above. Monitor on telemetry. Monitor continuous pulse-ox. NPO. Refraining from pharmacologic DVT prophylaxis. Repeat INR in the morning. GI consult, as above.  Add on the following to labs initially collected in the ED today: Iron studies, MMA, folic acid level, reticulocyte count.        #) Essential hypertension: Documented history of such, for which the patient was previously treated with lisinopril.  He reports his blood pressure is improved with lifestyle modifications, that he is no longer on lisinopril or any other antihypertensive medication at home.  Normotensive blood pressures noted in the ED this evening.  Plan: We will closely monitor ensuing blood pressure via routine vital signs in the setting of suspected presenting acute upper GI bleed, as above.      #) asthma: intermittent; outpatient respiratory regimen consists of as needed albuterol inhaler.  No evidence of acute asthma exacerbation at this time.  Plan: Monitoring on continuous pulse oximetry as competitive evaluation for acute upper gastrointestinal bleed.  Add on serum magnesium level.  As needed albuterol inhaler.     #) Allergic rhinitis: On scheduled cetirizine as an outpatient.  Plan: In the setting of current n.p.o. status, hold cetirizine for  now.       DVT prophylaxis: scd's  Code Status: Full code Family Communication: none Disposition Plan: Per Rounding Team Consults called: on-call GI notified of consult by ED physician, as further detailed above.   Admission status: observation; pcu     Of note, this patient was added by me  to the following Admit List/Treatment Team: [facility].      PLEASE NOTE THAT DRAGON DICTATION SOFTWARE WAS USED IN THE CONSTRUCTION OF THIS NOTE.   Angie Fava DO [facility] Pager [phone] From 6PM - 2AM  Otherwise, please contact night-coverage  [email or web address] Password [facility]   02/02/2021, 6:49 PM

## 2021-02-02 NOTE — Progress Notes (Signed)
Brief note regarding plan, with full H&P to follow:  36 year old male with history of hypertension, asthma, who was admitted to [facility] for further evaluation and management of suspected acute upper gastrointestinal bleed after presenting to [facility] ED complaining of 2 days of dizziness associated with 3-4 episodes of dark stool.  Sinus tachycardia with heart rate in the low 100s, but normotensive.  Asymptomatic at this time. guaiac positive in the ED. presenting hemoglobin 12.4, most recent prior several years ago.  No history of liver failure or alcohol abuse.  Not on any blood thinners or NSAIDs as an outpatient. On-call GI consulted.  On Protonix drip.  Every 4 hour H&H checks have been ordered.    Newton Pigg, DO Hospitalist

## 2021-02-02 NOTE — ED Triage Notes (Signed)
Pt BIB EMS from [facility]. Pt c/o black tarry stools, states he has had 3 since last night. Pt c/o dizziness since this AM. EMS reports pt was slightly diaphoretic upon their arrival. Pt denies diabetes.  20G L AC 100 mL NS given by EMS CBG 312 152/78 110 HR 98% RA

## 2021-02-02 NOTE — ED Notes (Signed)
Pt 100% RA upon arrival to room 6.

## 2021-02-02 NOTE — ED Notes (Signed)
Pt c/o sudden shortness of breath. Pt being transferred to room 6.

## 2021-02-03 ENCOUNTER — Encounter (HOSPITAL_COMMUNITY): Payer: Self-pay | Admitting: Internal Medicine

## 2021-02-03 ENCOUNTER — Observation Stay (HOSPITAL_COMMUNITY): Payer: BC Managed Care – PPO | Admitting: Certified Registered"

## 2021-02-03 ENCOUNTER — Encounter (HOSPITAL_COMMUNITY)
Admission: EM | Disposition: A | Discharge status: Home / Self Care | Payer: Self-pay | Source: Home / Self Care | Attending: Emergency Medicine

## 2021-02-03 DIAGNOSIS — K922 Gastrointestinal hemorrhage, unspecified: Secondary | ICD-10-CM | POA: Diagnosis not present

## 2021-02-03 DIAGNOSIS — D62 Acute posthemorrhagic anemia: Secondary | ICD-10-CM | POA: Diagnosis present

## 2021-02-03 DIAGNOSIS — J309 Allergic rhinitis, unspecified: Secondary | ICD-10-CM | POA: Diagnosis not present

## 2021-02-03 DIAGNOSIS — R42 Dizziness and giddiness: Secondary | ICD-10-CM

## 2021-02-03 DIAGNOSIS — M25572 Pain in left ankle and joints of left foot: Secondary | ICD-10-CM

## 2021-02-03 HISTORY — PX: ESOPHAGOGASTRODUODENOSCOPY (EGD) WITH PROPOFOL: SHX5813

## 2021-02-03 LAB — COMPREHENSIVE METABOLIC PANEL
ALT: 63 U/L — ABNORMAL HIGH (ref 0–44)
AST: 22 U/L (ref 15–41)
Albumin: 3.9 g/dL (ref 3.5–5.0)
Alkaline Phosphatase: 60 U/L (ref 38–126)
Anion gap: 12 (ref 5–15)
BUN: 20 mg/dL (ref 6–20)
CO2: 23 mmol/L (ref 22–32)
Calcium: 9.4 mg/dL (ref 8.9–10.3)
Chloride: 106 mmol/L (ref 98–111)
Creatinine, Ser: 0.95 mg/dL (ref 0.61–1.24)
GFR, Estimated: >60 mL/min (ref >60)
Glucose, Bld: 185 mg/dL — ABNORMAL HIGH (ref 70–99)
Potassium: 3.8 mmol/L (ref 3.5–5.1)
Sodium: 141 mmol/L (ref 135–145)
Total Bilirubin: 0.5 mg/dL (ref 0.3–1.2)
Total Protein: 6.9 g/dL (ref 6.5–8.1)

## 2021-02-03 LAB — IRON AND TIBC
Iron: 120 ug/dL (ref 45–182)
Saturation Ratios: 33 % (ref 17.9–39.5)
TIBC: 369 ug/dL (ref 250–450)
UIBC: 249 ug/dL

## 2021-02-03 LAB — ABO/RH: ABO/RH(D): O POS

## 2021-02-03 LAB — CBC
HCT: 35.1 % — ABNORMAL LOW (ref 39.0–52.0)
Hemoglobin: 11.8 g/dL — ABNORMAL LOW (ref 13.0–17.0)
MCH: 28.8 pg (ref 26.0–34.0)
MCHC: 33.6 g/dL (ref 30.0–36.0)
MCV: 85.6 fL (ref 80.0–100.0)
Platelets: 248 10*3/uL (ref 150–400)
RBC: 4.1 MIL/uL — ABNORMAL LOW (ref 4.22–5.81)
RDW: 12.4 % (ref 11.5–15.5)
WBC: 8.7 10*3/uL (ref 4.0–10.5)
nRBC: 0 % (ref 0.0–0.2)

## 2021-02-03 LAB — HIV ANTIBODY (ROUTINE TESTING W REFLEX): HIV Screen 4th Generation wRfx: NONREACTIVE

## 2021-02-03 LAB — RETICULOCYTES
Immature Retic Fract: 21.9 % — ABNORMAL HIGH (ref 2.3–15.9)
RBC.: 4.18 MIL/uL — ABNORMAL LOW (ref 4.22–5.81)
Retic Count, Absolute: 68.1 10*3/uL (ref 19.0–186.0)
Retic Ct Pct: 1.6 % (ref 0.4–3.1)

## 2021-02-03 LAB — HEMOGLOBIN AND HEMATOCRIT, BLOOD
HCT: 32.6 % — ABNORMAL LOW (ref 39.0–52.0)
Hemoglobin: 10.9 g/dL — ABNORMAL LOW (ref 13.0–17.0)

## 2021-02-03 LAB — PROTIME-INR
INR: 1 (ref 0.8–1.2)
Prothrombin Time: 13.3 seconds (ref 11.4–15.2)

## 2021-02-03 LAB — URIC ACID: Uric Acid, Serum: 8 mg/dL (ref 3.7–8.6)

## 2021-02-03 LAB — MAGNESIUM: Magnesium: 2 mg/dL (ref 1.7–2.4)

## 2021-02-03 LAB — SEDIMENTATION RATE: Sed Rate: 28 mm/hr — ABNORMAL HIGH (ref 0–16)

## 2021-02-03 LAB — FERRITIN: Ferritin: 118 ng/mL (ref 24–336)

## 2021-02-03 LAB — C-REACTIVE PROTEIN: CRP: 1.2 mg/dL — ABNORMAL HIGH (ref <1.0)

## 2021-02-03 LAB — FOLATE: Folate: 18.7 ng/mL (ref >5.9)

## 2021-02-03 SURGERY — ESOPHAGOGASTRODUODENOSCOPY (EGD) WITH PROPOFOL
Anesthesia: Monitor Anesthesia Care

## 2021-02-03 MED ORDER — PROPOFOL 10 MG/ML IV BOLUS
INTRAVENOUS | Status: AC
  Filled 2021-02-03: qty 20

## 2021-02-03 MED ORDER — LACTATED RINGERS IV SOLN
INTRAVENOUS | Status: AC | PRN
  Administered 2021-02-03: 1000 mL via INTRAVENOUS

## 2021-02-03 MED ORDER — ACETAMINOPHEN 325 MG PO TABS: 650.0000 mg | ORAL_TABLET | Freq: Four times a day (QID) | ORAL | Status: DC | PRN

## 2021-02-03 MED ORDER — PROPOFOL 10 MG/ML IV BOLUS
INTRAVENOUS | Status: DC | PRN
  Administered 2021-02-03: 50 mg via INTRAVENOUS
  Administered 2021-02-03 (×2): 30 mg via INTRAVENOUS
  Administered 2021-02-03: 50 mg via INTRAVENOUS
  Administered 2021-02-03 (×2): 30 mg via INTRAVENOUS
  Administered 2021-02-03: 50 mg via INTRAVENOUS
  Administered 2021-02-03: 30 mg via INTRAVENOUS

## 2021-02-03 MED ORDER — PANTOPRAZOLE SODIUM 40 MG IV SOLR
40.0000 mg | Freq: Two times a day (BID) | INTRAVENOUS | Status: DC
  Administered 2021-02-03 – 2021-02-04 (×3): 40 mg via INTRAVENOUS
  Filled 2021-02-03 (×3): qty 40

## 2021-02-03 MED ORDER — LIDOCAINE 2% (20 MG/ML) 5 ML SYRINGE
INTRAMUSCULAR | Status: DC | PRN
  Administered 2021-02-03: 20 mg via INTRAVENOUS
  Administered 2021-02-03: 80 mg via INTRAVENOUS

## 2021-02-03 SURGICAL SUPPLY — 14 items

## 2021-02-03 NOTE — Plan of Care (Signed)
  Problem: Education: Goal: Knowledge of General Education information will improve Description: Including pain rating scale, medication(s)/side effects and non-pharmacologic comfort measures 02/03/2021 0135 by Jackquline Denmark, RN Outcome: Progressing  Problem: Health Behavior/Discharge Planning: Goal: Ability to manage health-related needs will improve 02/03/2021 0135 by Jackquline Denmark, RN Outcome: Progressing Problem: Clinical Measurements: Goal: Ability to maintain clinical measurements within normal limits will improve 02/03/2021 0135 by Jackquline Denmark, RN Outcome: Progressing 4

## 2021-02-03 NOTE — Consult Note (Signed)
Referring Provider: Dr Linwood Dibbles (ED) Primary Care Physician:  Patient, No Pcp Per (Inactive) Primary Gastroenterologist:  Gentry Fitz  Reason for Consultation:  Anemia, melena  HPI: Derek Lawson is a 35 y.o. male with past medical history of asthma presenting for consultation of anemia and melena.  Patient states he has been having dizziness and shortness of breath for the last 2 days.  He states he started noticing black stools yesterday to the ED yesterday.  He denies any nausea, vomiting, hematemesis, dysphagia, changes in appetite, weight loss, diarrhea, constipation, or hematochezia.  Reports chronic GERD but is not on any maintenance medications.  He recently took 600 mg ibuprofen q8hours for 3-4 weeks following a dental extraction.  Denies ASA/blood thinner use.  Denies family history of colon cancer or gastrointestinal malignancy.  He has never had an EGD or colonoscopy.  He denies prior episodes of GI bleeding.  Past Medical History:  Diagnosis Date  . Asthma   . Hypertension     History reviewed. No pertinent surgical history.  Prior to Admission medications   Medication Sig Start Date End Date Taking? Authorizing Provider  albuterol (PROVENTIL) (2.5 MG/3ML) 0.083% nebulizer solution Take 2.5 mg by nebulization every 6 (six) hours as needed for wheezing or shortness of breath.   Yes [provider]  albuterol (VENTOLIN HFA) 108 (90 Base) MCG/ACT inhaler Inhale 1-2 puffs into the lungs every 6 (six) hours as needed for wheezing or shortness of breath. 10/01/20  Yes Hagler, Arlys John, MD  cetirizine (ZYRTEC) 10 MG chewable tablet Chew 10 mg by mouth daily.   Yes [provider]  ibuprofen (ADVIL) 600 MG tablet Take 1 tablet (600 mg total) by mouth every 6 (six) hours as needed. Patient taking differently: Take 600 mg by mouth every 6 (six) hours as needed for mild pain. 02/22/20  Yes Lamptey, Britta Mccreedy, MD  Melatonin 10 MG TABS Take 20 mg by mouth at bedtime.    Yes [provider]  Multiple Vitamin (MULTIVITAMIN WITH MINERALS) TABS tablet Take 1 tablet by mouth daily.   Yes [provider]  lisinopril (PRINIVIL,ZESTRIL) 10 MG tablet Take 1 tablet (10 mg total) by mouth daily. Patient not taking: No sig reported 04/15/16   Massie Maroon, FNP  predniSONE (DELTASONE) 20 MG tablet Take 2 tablets (40 mg total) by mouth daily. Patient not taking: No sig reported 10/01/20   Mardella Layman, MD  tamsulosin (FLOMAX) 0.4 MG CAPS capsule Take 1 capsule (0.4 mg total) by mouth daily. Patient not taking: No sig reported 08/18/20   Particia Nearing, PA-C  traMADol (ULTRAM) 50 MG tablet Take 1 tablet (50 mg total) by mouth every 6 (six) hours as needed. Patient not taking: No sig reported 08/18/20   Particia Nearing, PA-C  ALBUTEROL IN Inhale 2 puffs into the lungs 4 (four) times daily as needed.   08/10/14  [provider]  atorvastatin (LIPITOR) 20 MG tablet Take 1 tablet (20 mg total) by mouth daily. 04/16/16 02/22/20  Massie Maroon, FNP  montelukast (SINGULAIR) 10 MG tablet Take 1 tablet (10 mg total) by mouth at bedtime. 04/15/16 02/22/20  Massie Maroon, FNP    Scheduled Meds: Continuous Infusions: . sodium chloride 75 mL/hr at 02/02/21 2206  . pantoprozole (PROTONIX) infusion 8 mg/hr (02/03/21 0837)   PRN Meds:.albuterol  Allergies as of 02/02/2021 - Review Complete 02/02/2021  Allergen Reaction Noted  . Charentais melon (french melon) Hives 02/22/2020  . Shellfish allergy Hives 02/22/2020  .  Sulfa antibiotics Other (See Comments) 02/02/2021    Family History  Problem Relation Age of Onset  . Diabetes Father     Social History   Socioeconomic History  . Marital status: Married    Spouse name: Not on file  . Number of children: Not on file  . Years of education: Not on file  . Highest education level: Not on file  Occupational History  . Not on file  Tobacco Use  . Smoking status: Former Smoker     Packs/day: 0.50    Years: 0.50    Pack years: 0.25    Types: Cigarettes    Quit date: 08/10/2013    Years since quitting: 7.4  . Smokeless tobacco: Never Used  Substance and Sexual Activity  . Alcohol use: Not Currently    Comment: socially  . Drug use: No  . Sexual activity: Not on file  Other Topics Concern  . Not on file  Social History Narrative  . Not on file   Social Determinants of Health   Financial Resource Strain: Not on file  Food Insecurity: Not on file  Transportation Needs: Not on file  Physical Activity: Not on file  Stress: Not on file  Social Connections: Not on file  Intimate Partner Violence: Not on file    Review of Systems: Review of Systems  Constitutional: Positive for malaise/fatigue. Negative for weight loss.  HENT: Negative for hearing loss and tinnitus.   Eyes: Negative for pain and redness.  Respiratory: Positive for shortness of breath. Negative for cough.   Cardiovascular: Negative for chest pain and palpitations.  Gastrointestinal: Positive for heartburn and melena. Negative for abdominal pain, blood in stool, constipation, diarrhea, nausea and vomiting.  Genitourinary: Negative for flank pain and hematuria.  Musculoskeletal: Negative for falls and joint pain.  Skin: Negative for itching and rash.  Neurological: Positive for dizziness. Negative for loss of consciousness.  Endo/Heme/Allergies: Negative for polydipsia. Does not bruise/bleed easily.  Psychiatric/Behavioral: Negative for substance abuse. The patient is not nervous/anxious.      Physical Exam: Vital signs: Vitals:   02/03/21 0500 02/03/21 0850  BP: 131/90 (!) 124/95  Pulse: 91 85  Resp:  17  Temp: 97.7 F (36.5 C) 97.9 F (36.6 C)  SpO2: 93% 97%   Last BM Date: 02/02/21 Physical Exam Vitals reviewed.  HENT:     Head: Normocephalic and atraumatic.     Nose: Nose normal. No congestion.     Mouth/Throat:     Mouth: Mucous membranes are moist.     Pharynx:  Oropharynx is clear.  Eyes:     Extraocular Movements: Extraocular movements intact.     Conjunctiva/sclera: Conjunctivae normal.  Cardiovascular:     Rate and Rhythm: Normal rate and regular rhythm.     Pulses: Normal pulses.  Pulmonary:     Effort: Pulmonary effort is normal. No respiratory distress.  Abdominal:     General: There is no distension.     Palpations: Abdomen is soft. There is no mass.     Tenderness: There is no abdominal tenderness. There is no guarding or rebound.     Hernia: No hernia is present.  Musculoskeletal:        General: No swelling or tenderness.     Cervical back: Normal range of motion and neck supple.  Skin:    General: Skin is warm and dry.  Neurological:     General: No focal deficit present.     Mental Status: He is  alert and oriented to person, place, and time.  Psychiatric:        Mood and Affect: Mood normal.        Behavior: Behavior normal.      GI:  Lab Results: Recent Labs    02/02/21 1725 02/02/21 2107 02/03/21 0040 02/03/21 0909  WBC 8.9  --  8.7  --   HGB 12.4* 12.6* 11.8* 10.9*  HCT 36.6* 36.8* 35.1* 32.6*  PLT 233  --  248  --    BMET Recent Labs    02/02/21 1725 02/03/21 0040  NA 137 141  K 4.8 3.8  CL 101 106  CO2 27 23  GLUCOSE 295* 185*  BUN 24* 20  CREATININE 0.95 0.95  CALCIUM 9.5 9.4   LFT Recent Labs    02/03/21 0040  PROT 6.9  ALBUMIN 3.9  AST 22  ALT 63*  ALKPHOS 60  BILITOT 0.5   PT/INR Recent Labs    02/02/21 1725 02/03/21 0040  LABPROT 13.1 13.3  INR 1.0 1.0     Studies/Results: DG Chest 2 View  Result Date: 02/02/2021 CLINICAL DATA:  Shortness of breath EXAM: CHEST - 2 VIEW COMPARISON:  04/05/2015 FINDINGS: The heart size and mediastinal contours are within normal limits. Both lungs are clear. The visualized skeletal structures are unremarkable. IMPRESSION: No active cardiopulmonary disease. Electronically Signed   By: Alcide Clever M.D.   On: 02/02/2021 16:37     Impression: Suspected upper GI bleeding: melena and anemia.  Recent ibuprofen use (600 mg TID for 3-4 weeks).  DDx: Gastritis vs. PUD. -Hgb 10.9, decreased from 12.4 on arrival (some of which may be dilutional).  Baseline Hgb 16.4 as of 4 years ago. -BUN mildly elevated to 24 on arrival, normal Cr 0.95 -Normal ferritin, iron panel -Normal PT/INR  Plan: EGD today. I thoroughly discussed the procedure with the patient to include nature, alternatives, benefits, and risks (including but not limited to bleeding, infection, perforation, anesthesia/cardiac and pulmonary complications). Patient verbalized understanding and gave verbal consent to proceed with EGD.  Continue Protonix and supportive care.  Continue to monitor H&H with transfusion as needed to maintain Hgb >7.  [facility] will follow.   LOS: 0 days   Edrick Kins  PA-C 02/03/2021, 9:19 AM  Contact #  [phone]

## 2021-02-03 NOTE — Progress Notes (Signed)
PROGRESS NOTE    Derek Lawson    Code Status: Full Code  PHX:[id number] DOB: May 03, 1986 DOA: 02/02/2021 LOS: 0 days  PCP: Patient, No Pcp Per (Inactive) CC:  Chief Complaint  Patient presents with  . Melena  . Dizziness       Hospital Summary   This is a 35 year old male with a past medical history of hypertension, asthma, reported gout who presented to the ED on 4/18 with 2 days of dizziness, lightheadedness and 3-4 episodes of dark-colored stool.  Reported that he has been taking ibuprofen on and off 3 times daily for the the past month for suspected left ankle gout flare.  No history of GI bleeding, EGD or colonoscopy.  In the ED: Afebrile, tachycardic, normotensive and tolerating room air.  Labs significant for BUN 24 creatinine 0.95, Hb 12.4, platelets 233, INR 1.0, FOBT positive, COVID-19 negative.  GI was consulted and patient was placed on Protonix IV.  4/19: EGD with Dr. Watt Climes probable caustic gastritis, tiny hiatal hernia, normal duodenum and otherwise exam unremarkable    A & P   Principal Problem:   Acute upper GI bleed Active Problems:   Essential hypertension   Asthma, mild intermittent   Dizziness   Acute blood loss anemia   Allergic rhinitis   Left ankle pain   1. Acute normocytic anemia likely secondary to suspected upper GI bleed likely secondary to NSAID use a. Hb 12.4-> 10.9 in the past 24 hours b. 4/19: EGD with Dr. Watt Climes probable caustic gastritis, tiny hiatal hernia, normal duodenum and otherwise exam unremarkable c. Per GI: Clear liquid diet with no signs of bleeding can advance diet tomorrow and hopefully discharge tomorrow.  Continue current medications and holding aspirin and NSAIDs long-term.  Can discharge on 1 month of OTC PPI.  GI follow-up in 4 weeks  2. Left ankle pain a. Patient believes this to be an acute gout flare though this is less likely as his discomfort is located on the Achilles insertion point of his left ankle and is not  red or warm.  He has been taking NSAIDs for this b. Check ESR, CRP and uric acid level c. Could be a bone spur or other musculoskeltal issue d. If still persistent tomorrow then can consider an xray and/or outpatient follow up e. Recommend against NSAID use  3. Hypertension a. Stable off antihypertensives  4. Intermittent asthma, not in acute exacerbation  5. Allergic rhinitis a. Holding home cetirizine   DVT prophylaxis: SCDs Start: 02/02/21 1854   Family Communication: Patient's wife has been updated   Disposition Plan:  Status is: Observation  The patient remains OBS appropriate and will d/c before 2 midnights.  Dispo: The patient is from: Home              Anticipated d/c is to: Home              Patient currently is not medically stable to d/c.   Difficult to place patient No           Pressure injury documentation    None  Consultants  GI  Procedures  EGD 4/19  Antibiotics   Anti-infectives (From admission, onward)   None        Subjective   Patient seen and examined at bedside in no acute distress and resting comfortably post EGD and was tired postanesthesia.  Patient and his wife endorse NSAID use over the past week to month for suspected left ankle gout flare.  States that his pain is in the posterior left ankle.  No recurrent GI bleeding.  No other symptoms.  Objective   Vitals:   02/03/21 1100 02/03/21 1110 02/03/21 1120 02/03/21 1146  BP: 125/63 (!) 111/56 118/62 (!) 137/98  Pulse: (!) 122 (!) 113 100 97  Resp: $Remo'15 18 17 15  'CavMr$ Temp: (!) 97.5 F (36.4 C)   98.6 F (37 C)  TempSrc: Axillary   Oral  SpO2: 95% 92% 94% 94%  Weight:      Height:        Intake/Output Summary (Last 24 hours) at 02/03/2021 1621 Last data filed at 02/03/2021 1107 Gross per 24 hour  Intake 1122.75 ml  Output --  Net 1122.75 ml   Filed Weights   02/02/21 1559  Weight: 104.3 kg    Examination:  Physical Exam Vitals and nursing note reviewed.   Constitutional:      Appearance: Normal appearance.  HENT:     Head: Normocephalic and atraumatic.  Eyes:     Conjunctiva/sclera: Conjunctivae normal.  Cardiovascular:     Rate and Rhythm: Normal rate and regular rhythm.  Pulmonary:     Effort: Pulmonary effort is normal.     Breath sounds: Normal breath sounds.  Abdominal:     General: Abdomen is flat.     Palpations: Abdomen is soft.  Musculoskeletal:        General: No swelling.     Right ankle: No swelling or ecchymosis. No tenderness. Normal range of motion.     Right Achilles Tendon: Normal.     Left ankle: No swelling or ecchymosis. No tenderness. Normal range of motion.     Left Achilles Tendon: Tenderness present.       Legs:  Skin:    Coloration: Skin is not jaundiced or pale.  Neurological:     Mental Status: He is alert. Mental status is at baseline.  Psychiatric:        Mood and Affect: Mood normal.        Behavior: Behavior normal.     Data Reviewed: I have personally reviewed following labs and imaging studies  CBC: Recent Labs  Lab 02/02/21 1725 02/02/21 2107 02/03/21 0040 02/03/21 0909  WBC 8.9  --  8.7  --   NEUTROABS 6.3  --   --   --   HGB 12.4* 12.6* 11.8* 10.9*  HCT 36.6* 36.8* 35.1* 32.6*  MCV 85.3  --  85.6  --   PLT 233  --  248  --    Basic Metabolic Panel: Recent Labs  Lab 02/02/21 1725 02/03/21 0040  NA 137 141  K 4.8 3.8  CL 101 106  CO2 27 23  GLUCOSE 295* 185*  BUN 24* 20  CREATININE 0.95 0.95  CALCIUM 9.5 9.4  MG  --  2.0   GFR: Estimated Creatinine Clearance: 130 mL/min (by C-G formula based on SCr of 0.95 mg/dL). Liver Function Tests: Recent Labs  Lab 02/02/21 1725 02/03/21 0040  AST 25 22  ALT 71* 63*  ALKPHOS 63 60  BILITOT 0.7 0.5  PROT 7.4 6.9  ALBUMIN 4.2 3.9   No results for input(s): LIPASE, AMYLASE in the last 168 hours. No results for input(s): AMMONIA in the last 168 hours. Coagulation Profile: Recent Labs  Lab 02/02/21 1725 02/03/21 0040   INR 1.0 1.0   Cardiac Enzymes: No results for input(s): CKTOTAL, CKMB, CKMBINDEX, TROPONINI in the last 168 hours. BNP (last 3 results) No results for input(s):  PROBNP in the last 8760 hours. HbA1C: No results for input(s): HGBA1C in the last 72 hours. CBG: No results for input(s): GLUCAP in the last 168 hours. Lipid Profile: No results for input(s): CHOL, HDL, LDLCALC, TRIG, CHOLHDL, LDLDIRECT in the last 72 hours. Thyroid Function Tests: No results for input(s): TSH, T4TOTAL, FREET4, T3FREE, THYROIDAB in the last 72 hours. Anemia Panel: Recent Labs    02/03/21 0113 02/03/21 0459  FOLATE  --  18.7  FERRITIN  --  118  TIBC  --  369  IRON  --  120  RETICCTPCT 1.6  --    Sepsis Labs: No results for input(s): PROCALCITON, LATICACIDVEN in the last 168 hours.  Recent Results (from the past 240 hour(s))  Resp Panel by RT-PCR (Flu A&B, Covid) Nasopharyngeal Swab     Status: None   Collection Time: 02/02/21  6:48 PM   Specimen: Nasopharyngeal Swab; Nasopharyngeal(NP) swabs in vial transport medium  Result Value Ref Range Status   SARS Coronavirus 2 by RT PCR NEGATIVE NEGATIVE Final    Comment: (NOTE) SARS-CoV-2 target nucleic acids are NOT DETECTED.  The SARS-CoV-2 RNA is generally detectable in upper respiratory specimens during the acute phase of infection. The lowest concentration of SARS-CoV-2 viral copies this assay can detect is 138 copies/mL. A negative result does not preclude SARS-Cov-2 infection and should not be used as the sole basis for treatment or other patient management decisions. A negative result may occur with  improper specimen collection/handling, submission of specimen other than nasopharyngeal swab, presence of viral mutation(s) within the areas targeted by this assay, and inadequate number of viral copies(<138 copies/mL). A negative result must be combined with clinical observations, patient history, and epidemiological information. The expected  result is Negative.  Fact Sheet for Patients:  EntrepreneurPulse.com.au  Fact Sheet for Healthcare Providers:  IncredibleEmployment.be  This test is no t yet approved or cleared by the [place] FDA and  has been authorized for detection and/or diagnosis of SARS-CoV-2 by FDA under an Emergency Use Authorization (EUA). This EUA will remain  in effect (meaning this test can be used) for the duration of the COVID-19 declaration under Section 564(b)(1) of the Act, 21 U.S.C.section 360bbb-3(b)(1), unless the authorization is terminated  or revoked sooner.       Influenza A by PCR NEGATIVE NEGATIVE Final   Influenza B by PCR NEGATIVE NEGATIVE Final    Comment: (NOTE) The Xpert Xpress SARS-CoV-2/FLU/RSV plus assay is intended as an aid in the diagnosis of influenza from Nasopharyngeal swab specimens and should not be used as a sole basis for treatment. Nasal washings and aspirates are unacceptable for Xpert Xpress SARS-CoV-2/FLU/RSV testing.  Fact Sheet for Patients: EntrepreneurPulse.com.au  Fact Sheet for Healthcare Providers: IncredibleEmployment.be  This test is not yet approved or cleared by the [place] FDA and has been authorized for detection and/or diagnosis of SARS-CoV-2 by FDA under an Emergency Use Authorization (EUA). This EUA will remain in effect (meaning this test can be used) for the duration of the COVID-19 declaration under Section 564(b)(1) of the Act, 21 U.S.C. section 360bbb-3(b)(1), unless the authorization is terminated or revoked.  Performed at [facility], [place] [address] [place], [place] [address]          Radiology Studies: DG Chest 2 View  Result Date: 02/02/2021 CLINICAL DATA:  Shortness of breath EXAM: CHEST - 2 VIEW COMPARISON:  04/05/2015 FINDINGS: The heart size and mediastinal contours are within normal limits. Both lungs are clear. The  visualized skeletal structures are unremarkable. IMPRESSION: No active cardiopulmonary disease. Electronically Signed   By: Inez Catalina M.D.   On: 02/02/2021 16:37        Scheduled Meds: . pantoprazole (PROTONIX) IV  40 mg Intravenous Q12H   Continuous Infusions: . sodium chloride 75 mL/hr at 02/03/21 1302     Time spent: 26 minutes with over 50% of the time coordinating the patient's care    Harold Hedge, DO [facility]   Call night coverage person covering after 7pm

## 2021-02-03 NOTE — Op Note (Signed)
[facility] Patient Name: Derek Readernshaun Magouirk Procedure Date: 02/03/2021 MRN: [id number] [facility]: Vida RiggerMarc Jolissa Kapral , MD Date of Birth: 07-Jun-1986 CSN: [id number] Age: [age] Admit Type: Inpatient Procedure:                Upper GI endoscopy Indications:              Melena Providers:                Vida RiggerMarc Ilee Randleman, MD, [place] BailiffMaggie Chrismon, RN, Arlee Muslimhris Chandler                            Tech., Technician, [facility] Cent[facility]y Dee Williford, CRNA Referring MD:              Medicines:                Propofol total dose 300 mg IV, 100 mg IV lidocaine Complications:            No immediate complications. Estimated Blood Loss:     Estimated blood loss: none. Procedure:                Pre-Anesthesia Assessment:                           - Prior to the procedure, a History and Physical                            was performed, and patient medications and                            allergies were reviewed. The patient's tolerance of                            previous anesthesia was also reviewed. The risks                            and benefits of the procedure and the sedation                            options and risks were discussed with the patient.                            All questions were answered, and informed consent                            was obtained. Prior Anticoagulants: The patient has                            taken no previous anticoagulant or antiplatelet                            agents except for NSAID medication. ASA Grade                            Assessment: II - A patient with mild systemic  disease. After reviewing the risks and benefits,                            the patient was deemed in satisfactory condition to                            undergo the procedure.                           After obtaining informed consent, the endoscope was                            passed under direct vision. Throughout the                             procedure, the patient's blood pressure, pulse, and                            oxygen saturations were monitored continuously. The                            GIF-H190 ([id number]) Olympus gastroscope was                            introduced through the mouth, and advanced to the                            third part of duodenum. The upper GI endoscopy was                            accomplished without difficulty. The patient                            tolerated the procedure fairly well. Scope In: Scope Out: Findings:      The larynx was normal.      A Tiny hiatal hernia was present.      Localized minimal inflammation characterized by adherent blood, erythema       and friability was found on the greater curvature of the stomach. Lavage       of the area was performed using a moderate amount of sterile water,       resulting in clearance with good visualization and no adverse lesions       were seen and no active bleeding was seen. And a few blood clots and old       blood was suctioned as above      The duodenal bulb, first portion of the duodenum, second portion of the       duodenum and third portion of the duodenum were normal.      The exam was otherwise without abnormality. Impression:               - Normal larynx.                           - Tiny hiatal hernia.                           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                           - Normal duodenal bulb, first portion of the                            duodenum, second portion of the duodenum and third                            portion of the duodenum.                           - The examination was otherwise normal.                           - No specimens collected. Moderate Sedation:      Not Applicable - Patient had care per Anesthesia. Recommendation:           - Clear liquid diet today. If no signs of bleeding                            probable advance diet tomorrow and discharge                             tomorrow afternoon if stable                           - Continue present medications. As long as avoids                            aspirin and nonsteroidals can discharge on 1 month                            of over-the-counter pump inhibitor                           - No aspirin, ibuprofen, naproxen, or other                            non-steroidal anti-inflammatory drugs long-term.                           - Return to GI clinic in 4 weeks.                           - Telephone GI clinic if symptomatic PRN.                           - Check hemogram with white blood cell count and                            platelets in the morning. Also check basic                            metabolic profile and expect hemoglobin and BUN  to                            drop together consider repeat endoscopy as needed                            particularly if signs of repeat or continual                            bleeding Procedure Code(s):        --- Professional ---                           [phone], Esophagogastroduodenoscopy, flexible,                            transoral; diagnostic, including collection of                            specimen(s) by brushing or washing, when performed                            (separate procedure) Diagnosis Code(s):        --- Professional ---                           K44.9, Diaphragmatic hernia without obstruction or                            gangrene                           T54.94XA, Toxic effect of unspecified corrosive                            substance, undetermined, initial encounter                           T28.7XXA, Corrosion of other parts of alimentary                            tract, initial encounter                           K92.1, Melena (includes Hematochezia) CPT copyright 2019 [employer]. All rights reserved. The codes documented in this report are preliminary and upon coder review may  be revised to meet current  compliance requirements. Vida Rigger, MD 02/03/2021 11:04:18 AM This report has been signed electronically. Number of Addenda: 0

## 2021-02-03 NOTE — Anesthesia Postprocedure Evaluation (Signed)
Anesthesia Post Note  Patient: Derek Lawson  Procedure(s) Performed: ESOPHAGOGASTRODUODENOSCOPY (EGD) WITH PROPOFOL (N/A )     Patient location during evaluation: Endoscopy Anesthesia Type: MAC Level of consciousness: awake and alert Pain management: pain level controlled Vital Signs Assessment: post-procedure vital signs reviewed and stable Respiratory status: spontaneous breathing, nonlabored ventilation and respiratory function stable Cardiovascular status: blood pressure returned to baseline and stable Postop Assessment: no apparent nausea or vomiting Anesthetic complications: no   No complications documented.  Last Vitals:  Vitals:   02/03/21 1110 02/03/21 1120  BP: (!) 111/56 118/62  Pulse: (!) 113 100  Resp: 18 17  Temp:    SpO2: 92% 94%    Last Pain:  Vitals:   02/03/21 1120  TempSrc:   PainSc: 0-No pain                 Lidia Collum

## 2021-02-03 NOTE — Anesthesia Preprocedure Evaluation (Signed)
Anesthesia Evaluation  Patient identified by MRN, date of birth, ID band Patient awake    Reviewed: Allergy & Precautions, NPO status , Patient's Chart, lab work & pertinent test results  History of Anesthesia Complications Negative for: history of anesthetic complications  Airway Mallampati: III  TM Distance: >3 FB Neck ROM: Full    Dental   Pulmonary asthma , Patient abstained from smoking., former smoker,    Pulmonary exam normal        Cardiovascular hypertension, Normal cardiovascular exam     Neuro/Psych negative neurological ROS     GI/Hepatic Neg liver ROS, GERD  ,Suspected UGI bleed   Endo/Other  negative endocrine ROS  Renal/GU negative Renal ROS  negative genitourinary   Musculoskeletal negative musculoskeletal ROS (+)   Abdominal   Peds  Hematology  (+) anemia ,   Anesthesia Other Findings   Reproductive/Obstetrics                            Anesthesia Physical Anesthesia Plan  ASA: II  Anesthesia Plan: MAC   Post-op Pain Management:    Induction: Intravenous  PONV Risk Score and Plan: 1 and Propofol infusion, TIVA and Treatment may vary due to age or medical condition  Airway Management Planned: Natural Airway, Nasal Cannula and Simple Face Mask  Additional Equipment: None  Intra-op Plan:   Post-operative Plan:   Informed Consent: I have reviewed the patients History and Physical, chart, labs and discussed the procedure including the risks, benefits and alternatives for the proposed anesthesia with the patient or authorized representative who has indicated his/her understanding and acceptance.       Plan Discussed with:   Anesthesia Plan Comments:         Anesthesia Quick Evaluation

## 2021-02-03 NOTE — Transfer of Care (Signed)
Immediate Anesthesia Transfer of Care Note  Patient: Derek Lawson  Procedure(s) Performed: ESOPHAGOGASTRODUODENOSCOPY (EGD) WITH PROPOFOL (N/A )  Patient Location: PACU  Anesthesia Type:MAC  Level of Consciousness: awake, alert  and oriented  Airway & Oxygen Therapy: Patient Spontanous Breathing and Patient connected to face mask oxygen  Post-op Assessment: Report given to RN and Post -op Vital signs reviewed and stable  Post vital signs: Reviewed and stable  Last Vitals:  Vitals Value Taken Time  BP 125/63 02/03/21 1100  Temp 36.4 C 02/03/21 1100  Pulse 115 02/03/21 1107  Resp 26 02/03/21 1107  SpO2 92 % 02/03/21 1107  Vitals shown include unvalidated device data.  Last Pain:  Vitals:   02/03/21 1100  TempSrc: Axillary  PainSc: Asleep         Complications: No complications documented.

## 2021-02-04 DIAGNOSIS — K922 Gastrointestinal hemorrhage, unspecified: Secondary | ICD-10-CM | POA: Diagnosis not present

## 2021-02-04 LAB — CBC
HCT: 28.3 % — ABNORMAL LOW (ref 39.0–52.0)
Hemoglobin: 9.6 g/dL — ABNORMAL LOW (ref 13.0–17.0)
MCH: 29 pg (ref 26.0–34.0)
MCHC: 33.9 g/dL (ref 30.0–36.0)
MCV: 85.5 fL (ref 80.0–100.0)
Platelets: 198 10*3/uL (ref 150–400)
RBC: 3.31 MIL/uL — ABNORMAL LOW (ref 4.22–5.81)
RDW: 12.4 % (ref 11.5–15.5)
WBC: 6.7 10*3/uL (ref 4.0–10.5)
nRBC: 0 % (ref 0.0–0.2)

## 2021-02-04 LAB — BASIC METABOLIC PANEL
Anion gap: 6 (ref 5–15)
BUN: 20 mg/dL (ref 6–20)
CO2: 24 mmol/L (ref 22–32)
Calcium: 7.5 mg/dL — ABNORMAL LOW (ref 8.9–10.3)
Chloride: 110 mmol/L (ref 98–111)
Creatinine, Ser: 0.83 mg/dL (ref 0.61–1.24)
GFR, Estimated: >60 mL/min (ref >60)
Glucose, Bld: 153 mg/dL — ABNORMAL HIGH (ref 70–99)
Potassium: 3.2 mmol/L — ABNORMAL LOW (ref 3.5–5.1)
Sodium: 140 mmol/L (ref 135–145)

## 2021-02-04 LAB — DIFFERENTIAL
Abs Immature Granulocytes: 0.04 10*3/uL (ref 0.00–0.07)
Basophils Absolute: 0 10*3/uL (ref 0.0–0.1)
Basophils Relative: 0 %
Eosinophils Absolute: 0.2 10*3/uL (ref 0.0–0.5)
Eosinophils Relative: 3 %
Immature Granulocytes: 1 %
Lymphocytes Relative: 39 %
Lymphs Abs: 2.6 10*3/uL (ref 0.7–4.0)
Monocytes Absolute: 0.5 10*3/uL (ref 0.1–1.0)
Monocytes Relative: 7 %
Neutro Abs: 3.3 10*3/uL (ref 1.7–7.7)
Neutrophils Relative %: 50 %

## 2021-02-04 MED ORDER — PANTOPRAZOLE SODIUM 40 MG PO TBEC: 40.0000 mg | DELAYED_RELEASE_TABLET | Freq: Every day | ORAL | 0 refills | Status: DC

## 2021-02-04 MED ORDER — LISINOPRIL 10 MG PO TABS: 10.0000 mg | ORAL_TABLET | Freq: Every day | ORAL | 1 refills | Status: AC

## 2021-02-04 MED ORDER — POTASSIUM CHLORIDE CRYS ER 20 MEQ PO TBCR
20.0000 meq | EXTENDED_RELEASE_TABLET | Freq: Once | ORAL | Status: AC
  Administered 2021-02-04: 20 meq via ORAL
  Filled 2021-02-04: qty 1

## 2021-02-04 MED ORDER — PANTOPRAZOLE SODIUM 40 MG PO TBEC: 40.0000 mg | DELAYED_RELEASE_TABLET | Freq: Two times a day (BID) | ORAL | Status: DC

## 2021-02-04 MED ORDER — LISINOPRIL 10 MG PO TABS: 10.0000 mg | ORAL_TABLET | Freq: Every day | ORAL | 1 refills | Status: DC

## 2021-02-04 MED ORDER — LISINOPRIL 10 MG PO TABS: 10.0000 mg | ORAL_TABLET | Freq: Every day | ORAL | Status: DC

## 2021-02-04 NOTE — Discharge Summary (Signed)
Physician Discharge Summary  Derek Lawson QBH:[id number] DOB: July 14, 1986   PCP: Patient, No Pcp Per (Inactive)  Admit date: 02/02/2021 Discharge date: 02/04/2021 Length of Stay: 0 days   Code Status: Full Code  Admitted From:  Home Discharged to:   [place]:  None  Equipment/Devices:  None Discharge Condition:  Stable  Recommendations for Outpatient Follow-up   1. Follow up with GI in 1 week 2. Follow up CBC 3. Consider further gout workup as patient has been taking high doses of NSAIDs at home for suspected gout flares though his reported symptoms were not consistent with gout  Hospital Summary  This is a 35 year old male with a past medical history of hypertension, asthma, reported gout who presented to the ED on 4/18 with 2 days of dizziness, lightheadedness and 3-4 episodes of dark-colored stool.  Reported that he has been taking ibuprofen on and off 3 times daily for the the past month for suspected left ankle gout flare.  No history of GI bleeding, EGD or colonoscopy.  In the ED: Afebrile, tachycardic, normotensive and tolerating room air.  Labs significant for BUN 24 creatinine 0.95, Hb 12.4, platelets 233, INR 1.0, FOBT positive, COVID-19 negative.  GI was consulted and patient was placed on Protonix IV.  4/19: EGD with Dr. Watt Climes probable caustic gastritis, tiny hiatal hernia, normal duodenum and otherwise exam unremarkable 4/20: Hb dropped overnight but this is likely from hydration as he did not have any recurrent bleeding. He tolerated PO intake and was discharged in stable condition with instruction to avoid NSAIDs and Aspirin, get repeat lab work at the end of the week and follow up with GI next week. He was discharged with PPI qd per GI  A & P   Principal Problem:   Acute upper GI bleed Active Problems:   Essential hypertension   Asthma, mild intermittent   Dizziness   Acute blood loss anemia   Allergic rhinitis   Left ankle pain     1. Acute  normocytic anemia likely secondary to suspected upper GI bleed likely secondary to NSAID use a. Hb 12.4-> 10.9 -> 9.6 in the past 24 hours without any signs of active bleeding. Probably further dropped from IV fluids overnight  b. 4/19: EGD with Dr. Watt Climes probable caustic gastritis, tiny hiatal hernia, normal duodenum and otherwise exam unremarkable c. Discussed with GI: ok to discharge with PPI once daily, avoid NSAIDs and close outpatient follow up d. Continue current medications and holding aspirin and NSAIDs long-term.    2. Left ankle pain, resolved a. Patient believes this to be an acute gout flare though this is less likely as his discomfort is located on the Achilles insertion point of his left ankle and is not red or warm.  He has been taking NSAIDs for this b. ESR 28, CRP minimally elevated 1.2 and uric acid level unremarkable c. Could be a bone spur or other musculoskeltal issue d. Outpatient follow up e. Recommend against NSAID use, Tylenol PRN  3. Hypertension, stable a. Continue home lisinopril  4. Intermittent asthma, not in acute exacerbation  5. Allergic rhinitis a. Continue home cetirizine     Consultants  . GI  Procedures  . EGD 4/19   Antibiotics   Anti-infectives (From admission, onward)   None       Subjective  Patient seen and examined at bedside no acute distress and resting comfortably.  No events overnight.  Tolerating diet. In good spirits and anticipating discharge.  Denies any chest pain, shortness of breath, fever, nausea, vomiting, urinary or bowel complaints. Otherwise ROS negative    Objective   Discharge Exam: Vitals:   02/04/21 0439 02/04/21 1317  BP: 128/80 (!) 147/102  Pulse: 74 77  Resp: 16 (!) 25  Temp: 97.7 F (36.5 C) 98.4 F (36.9 C)  SpO2: 98% 99%   Vitals:   02/03/21 1146 02/03/21 2042 02/04/21 0439 02/04/21 1317  BP: (!) 137/98 128/81 128/80 (!) 147/102  Pulse: 97 75 74 77  Resp: 15 18 16  (!) 25  Temp:  98.6 F (37 C) 98.1 F (36.7 C) 97.7 F (36.5 C) 98.4 F (36.9 C)  TempSrc: Oral Oral Oral   SpO2: 94% 97% 98% 99%  Weight:      Height:        Physical Exam Vitals and nursing note reviewed.  Constitutional:      Appearance: Normal appearance.  HENT:     Head: Normocephalic and atraumatic.  Eyes:     Conjunctiva/sclera: Conjunctivae normal.  Cardiovascular:     Rate and Rhythm: Normal rate and regular rhythm.  Pulmonary:     Effort: Pulmonary effort is normal.     Breath sounds: Normal breath sounds.  Abdominal:     General: Abdomen is flat.     Palpations: Abdomen is soft.  Musculoskeletal:        General: No swelling or tenderness.  Skin:    Coloration: Skin is not jaundiced or pale.  Neurological:     Mental Status: He is alert. Mental status is at baseline.  Psychiatric:        Mood and Affect: Mood normal.        Behavior: Behavior normal.       The results of significant diagnostics from this hospitalization (including imaging, microbiology, ancillary and laboratory) are listed below for reference.     Microbiology: Recent Results (from the past 240 hour(s))  Resp Panel by RT-PCR (Flu A&B, Covid) Nasopharyngeal Swab     Status: None   Collection Time: 02/02/21  6:48 PM   Specimen: Nasopharyngeal Swab; Nasopharyngeal(NP) swabs in vial transport medium  Result Value Ref Range Status   SARS Coronavirus 2 by RT PCR NEGATIVE NEGATIVE Final    Comment: (NOTE) SARS-CoV-2 target nucleic acids are NOT DETECTED.  The SARS-CoV-2 RNA is generally detectable in upper respiratory specimens during the acute phase of infection. The lowest concentration of SARS-CoV-2 viral copies this assay can detect is 138 copies/mL. A negative result does not preclude SARS-Cov-2 infection and should not be used as the sole basis for treatment or other patient management decisions. A negative result may occur with  improper specimen collection/handling, submission of specimen  other than nasopharyngeal swab, presence of viral mutation(s) within the areas targeted by this assay, and inadequate number of viral copies(<138 copies/mL). A negative result must be combined with clinical observations, patient history, and epidemiological information. The expected result is Negative.  Fact Sheet for Patients:  02/04/21  Fact Sheet for Healthcare Providers:  BloggerCourse.com  This test is no t yet approved or cleared by the [employer] FDA and  has been authorized for detection and/or diagnosis of SARS-CoV-2 by FDA under an Emergency Use Authorization (EUA). This EUA will remain  in effect (meaning this test can be used) for the duration of the COVID-19 declaration under Section 564(b)(1) of the Act, 21 U.S.C.section 360bbb-3(b)(1), unless the authorization is terminated  or revoked sooner.       Influenza A  by PCR NEGATIVE NEGATIVE Final   Influenza B by PCR NEGATIVE NEGATIVE Final    Comment: (NOTE) The Xpert Xpress SARS-CoV-2/FLU/RSV plus assay is intended as an aid in the diagnosis of influenza from Nasopharyngeal swab specimens and should not be used as a sole basis for treatment. Nasal washings and aspirates are unacceptable for Xpert Xpress SARS-CoV-2/FLU/RSV testing.  Fact Sheet for Patients: EntrepreneurPulse.com.au  Fact Sheet for Healthcare Providers: IncredibleEmployment.be  This test is not yet approved or cleared by the [place] FDA and has been authorized for detection and/or diagnosis of SARS-CoV-2 by FDA under an Emergency Use Authorization (EUA). This EUA will remain in effect (meaning this test can be used) for the duration of the COVID-19 declaration under Section 564(b)(1) of the Act, 21 U.S.C. section 360bbb-3(b)(1), unless the authorization is terminated or revoked.  Performed at [facility], [place] [address], [place], [place] [address]      Labs: BNP (last 3 results) No results for input(s): BNP in the last 8760 hours. Basic Metabolic Panel: Recent Labs  Lab 02/02/21 1725 02/03/21 0040 02/04/21 0446  NA 137 141 140  K 4.8 3.8 3.2*  CL 101 106 110  CO2 $Re'27 23 24  'DZv$ GLUCOSE 295* 185* 153*  BUN 24* 20 20  CREATININE 0.95 0.95 0.83  CALCIUM 9.5 9.4 7.5*  MG  --  2.0  --    Liver Function Tests: Recent Labs  Lab 02/02/21 1725 02/03/21 0040  AST 25 22  ALT 71* 63*  ALKPHOS 63 60  BILITOT 0.7 0.5  PROT 7.4 6.9  ALBUMIN 4.2 3.9   No results for input(s): LIPASE, AMYLASE in the last 168 hours. No results for input(s): AMMONIA in the last 168 hours. CBC: Recent Labs  Lab 02/02/21 1725 02/02/21 2107 02/03/21 0040 02/03/21 0909 02/04/21 0549  WBC 8.9  --  8.7  --  6.7  NEUTROABS 6.3  --   --   --  3.3  HGB 12.4* 12.6* 11.8* 10.9* 9.6*  HCT 36.6* 36.8* 35.1* 32.6* 28.3*  MCV 85.3  --  85.6  --  85.5  PLT 233  --  248  --  198   Cardiac Enzymes: No results for input(s): CKTOTAL, CKMB, CKMBINDEX, TROPONINI in the last 168 hours. BNP: Invalid input(s): POCBNP CBG: No results for input(s): GLUCAP in the last 168 hours. D-Dimer No results for input(s): DDIMER in the last 72 hours. Hgb A1c No results for input(s): HGBA1C in the last 72 hours. Lipid Profile No results for input(s): CHOL, HDL, LDLCALC, TRIG, CHOLHDL, LDLDIRECT in the last 72 hours. Thyroid function studies No results for input(s): TSH, T4TOTAL, T3FREE, THYROIDAB in the last 72 hours.  Invalid input(s): FREET3 Anemia work up Recent Labs    02/03/21 0113 02/03/21 0459  FOLATE  --  18.7  FERRITIN  --  118  TIBC  --  369  IRON  --  120  RETICCTPCT 1.6  --    Urinalysis    Component Value Date/Time   COLORURINE YELLOW 09/08/2015 1136   APPEARANCEUR CLEAR 09/08/2015 1136   LABSPEC 1.025 08/18/2020 1748   PHURINE 6.0 08/18/2020 [place] 08/18/2020 1748   HGBUR NEGATIVE 08/18/2020  1748   BILIRUBINUR NEGATIVE 08/18/2020 1748   KETONESUR TRACE (A) 08/18/2020 1748   PROTEINUR 30 (A) 08/18/2020 1748   UROBILINOGEN 0.2 08/18/2020 1748   NITRITE NEGATIVE 08/18/2020 1748   LEUKOCYTESUR NEGATIVE 08/18/2020 1748   Sepsis Labs Invalid input(s): PROCALCITONIN,  WBC,  LACTICIDVEN Microbiology Recent Results (from the past 240 hour(s))  Resp Panel by RT-PCR (Flu A&B, Covid) Nasopharyngeal Swab     Status: None   Collection Time: 02/02/21  6:48 PM   Specimen: Nasopharyngeal Swab; Nasopharyngeal(NP) swabs in vial transport medium  Result Value Ref Range Status   SARS Coronavirus 2 by RT PCR NEGATIVE NEGATIVE Final    Comment: (NOTE) SARS-CoV-2 target nucleic acids are NOT DETECTED.  The SARS-CoV-2 RNA is generally detectable in upper respiratory specimens during the acute phase of infection. The lowest concentration of SARS-CoV-2 viral copies this assay can detect is 138 copies/mL. A negative result does not preclude SARS-Cov-2 infection and should not be used as the sole basis for treatment or other patient management decisions. A negative result may occur with  improper specimen collection/handling, submission of specimen other than nasopharyngeal swab, presence of viral mutation(s) within the areas targeted by this assay, and inadequate number of viral copies(<138 copies/mL). A negative result must be combined with clinical observations, patient history, and epidemiological information. The expected result is Negative.  Fact Sheet for Patients:  EntrepreneurPulse.com.au  Fact Sheet for Healthcare Providers:  IncredibleEmployment.be  This test is no t yet approved or cleared by the [place] FDA and  has been authorized for detection and/or diagnosis of SARS-CoV-2 by FDA under an Emergency Use Authorization (EUA). This EUA will remain  in effect (meaning this test can be used) for the duration of the COVID-19 declaration  under Section 564(b)(1) of the Act, 21 U.S.C.section 360bbb-3(b)(1), unless the authorization is terminated  or revoked sooner.       Influenza A by PCR NEGATIVE NEGATIVE Final   Influenza B by PCR NEGATIVE NEGATIVE Final    Comment: (NOTE) The Xpert Xpress SARS-CoV-2/FLU/RSV plus assay is intended as an aid in the diagnosis of influenza from Nasopharyngeal swab specimens and should not be used as a sole basis for treatment. Nasal washings and aspirates are unacceptable for Xpert Xpress SARS-CoV-2/FLU/RSV testing.  Fact Sheet for Patients: EntrepreneurPulse.com.au  Fact Sheet for Healthcare Providers: IncredibleEmployment.be  This test is not yet approved or cleared by the [place] FDA and has been authorized for detection and/or diagnosis of SARS-CoV-2 by FDA under an Emergency Use Authorization (EUA). This EUA will remain in effect (meaning this test can be used) for the duration of the COVID-19 declaration under Section 564(b)(1) of the Act, 21 U.S.C. section 360bbb-3(b)(1), unless the authorization is terminated or revoked.  Performed at [facility], [place] [address], [place], [place] [address]     Discharge Instructions     Discharge Instructions    Diet - low sodium heart healthy   Complete by: As directed    Discharge instructions   Complete by: As directed    You were seen in the hospital for a suspected GI bleed.   Upon discharge: - Take Pantoprazole (Protonix) 40 mg once daily, 30 mins prior to breakfast  - Do Not take aspirin, ibuprofen, Motrin, Advil, naproxen, Aleve or any other NSAIDs as this can cause recurrence of your bleeding - If you are having pain then take Tylenol as prescribed on the bottle  - Get repeat lab work on Friday and follow up with your gastroenterologist next week  If you have any change or worsening of your symptoms do not hesitate to contact your primary care physician or  return to the ED.   Increase activity slowly   Complete by: As directed  Allergies as of 02/04/2021      Reactions   Charentais Melon (french Melon) Hives   Honeydew melon   Shellfish Allergy Hives   Sulfa Antibiotics Other (See Comments)   unknown      Medication List    STOP taking these medications   ibuprofen 600 MG tablet Commonly known as: ADVIL   Melatonin 10 MG Tabs   predniSONE 20 MG tablet Commonly known as: DELTASONE   tamsulosin 0.4 MG Caps capsule Commonly known as: FLOMAX   traMADol 50 MG tablet Commonly known as: ULTRAM     TAKE these medications   albuterol 108 (90 Base) MCG/ACT inhaler Commonly known as: VENTOLIN HFA Inhale 1-2 puffs into the lungs every 6 (six) hours as needed for wheezing or shortness of breath. What changed: Another medication with the same name was removed. Continue taking this medication, and follow the directions you see here.   cetirizine 10 MG chewable tablet Commonly known as: ZYRTEC Chew 10 mg by mouth daily.   lisinopril 10 MG tablet Commonly known as: ZESTRIL Take 1 tablet (10 mg total) by mouth daily.   multivitamin with minerals Tabs tablet Take 1 tablet by mouth daily.   pantoprazole 40 MG tablet Commonly known as: PROTONIX Take 1 tablet (40 mg total) by mouth daily.       Allergies  Allergen Reactions  . Charentais Melon (French Melon) Hives    Honeydew melon   . Shellfish Allergy Hives  . Sulfa Antibiotics Other (See Comments)    unknown     Dispo: The patient is from: Home              Anticipated d/c is to: Home              Patient currently is medically stable to d/c.   Difficult to place patient No       Time coordinating discharge: Over 30 minutes   SIGNED:   Harold Hedge, D.O. [facility] Pager: [phone]  02/04/2021, 4:42 PM

## 2021-02-04 NOTE — Progress Notes (Addendum)
PROGRESS NOTE    Derek Lawson    Code Status: Full Code  SEG:[id number] DOB: 05/24/1986 DOA: 02/02/2021 LOS: 0 days  PCP: Patient, No Pcp Per (Inactive) CC:  Chief Complaint  Patient presents with  . Melena  . Dizziness       Hospital Summary   This is a 35 year old male with a past medical history of hypertension, asthma, reported gout who presented to the ED on 4/18 with 2 days of dizziness, lightheadedness and 3-4 episodes of dark-colored stool.  Reported that he has been taking ibuprofen on and off 3 times daily for the the past month for suspected left ankle gout flare.  No history of GI bleeding, EGD or colonoscopy.  In the ED: Afebrile, tachycardic, normotensive and tolerating room air.  Labs significant for BUN 24 creatinine 0.95, Hb 12.4, platelets 233, INR 1.0, FOBT positive, COVID-19 negative.  GI was consulted and patient was placed on Protonix IV.  4/19: EGD with Dr. Watt Climes probable caustic gastritis, tiny hiatal hernia, normal duodenum and otherwise exam unremarkable    A & P   Principal Problem:   Acute upper GI bleed Active Problems:   Essential hypertension   Asthma, mild intermittent   Dizziness   Acute blood loss anemia   Allergic rhinitis   Left ankle pain   1. Acute normocytic anemia likely secondary to suspected upper GI bleed likely secondary to NSAID use a. Hb 12.4-> 10.9 -> 9.6 in the past 24 hours without any signs of active bleeding. Possibly from IV fluids overnight vs bleed b. 4/19: EGD with Dr. Watt Climes probable caustic gastritis, tiny hiatal hernia, normal duodenum and otherwise exam unremarkable c. Discussed with Dr. Watt Climes, GI: Advance diet today. If he can tolerate dinner without any recurrence of bleeding then he can either later today or tomorrow morning.  d. Continue current medications and holding aspirin and NSAIDs long-term.  Can discharge on 1 month of OTC PPI when able.  GI follow-up in 4 weeks e. Discontinue IV fluids and change  PPI to PO  2. Left ankle pain, resolved a. Patient believes this to be an acute gout flare though this is less likely as his discomfort is located on the Achilles insertion point of his left ankle and is not red or warm.  He has been taking NSAIDs for this b. ESR 28, CRP minimally elevated 1.2 and uric acid level unremarkable c. Check ESR, CRP and Uric acid level 8.0 d. Could be a bone spur or other musculoskeltal issue e. Outpatient follow up f. Recommend against NSAID use  3. Hypertension, stable a. off antihypertensives  4. Intermittent asthma, not in acute exacerbation  5. Allergic rhinitis a. Holding home cetirizine   DVT prophylaxis: SCDs Start: 02/02/21 1854   Family Communication: Patient's wife has been updated yesterday  Disposition Plan: likely DC home tomorrow if no recurrence of bleeding after tolerating dinner tonight Status is: Observation  The patient remains OBS appropriate and will d/c before 2 midnights.  Dispo: The patient is from: Home              Anticipated d/c is to: Home              Patient currently is not medically stable to d/c.   Difficult to place patient No           Pressure injury documentation    None  Consultants  GI  Procedures  EGD 4/19  Antibiotics   Anti-infectives (From admission, onward)  None        Subjective   Reports no recurrence of bleeding. Resolved left ankle pain. Asking to advance diet. No other complaints or overnight events.  Objective   Vitals:   02/03/21 1120 02/03/21 1146 02/03/21 2042 02/04/21 0439  BP: 118/62 (!) 137/98 128/81 128/80  Pulse: 100 97 75 74  Resp: $Remo'17 15 18 16  'mczGp$ Temp:  98.6 F (37 C) 98.1 F (36.7 C) 97.7 F (36.5 C)  TempSrc:  Oral Oral Oral  SpO2: 94% 94% 97% 98%  Weight:      Height:        Intake/Output Summary (Last 24 hours) at 02/04/2021 1048 Last data filed at 02/04/2021 0700 Gross per 24 hour  Intake 2299.66 ml  Output --  Net 2299.66 ml   Filed  Weights   02/02/21 1559  Weight: 104.3 kg    Examination:  Physical Exam Vitals and nursing note reviewed.  Constitutional:      Appearance: Normal appearance.  HENT:     Head: Normocephalic and atraumatic.  Eyes:     Conjunctiva/sclera: Conjunctivae normal.  Cardiovascular:     Rate and Rhythm: Normal rate and regular rhythm.  Pulmonary:     Effort: Pulmonary effort is normal.     Breath sounds: Normal breath sounds.  Abdominal:     General: Abdomen is flat.     Palpations: Abdomen is soft.  Musculoskeletal:        General: No swelling or tenderness.  Skin:    Coloration: Skin is not jaundiced or pale.  Neurological:     Mental Status: He is alert. Mental status is at baseline.  Psychiatric:        Mood and Affect: Mood normal.        Behavior: Behavior normal.     Data Reviewed: I have personally reviewed following labs and imaging studies  CBC: Recent Labs  Lab 02/02/21 1725 02/02/21 2107 02/03/21 0040 02/03/21 0909 02/04/21 0549  WBC 8.9  --  8.7  --  6.7  NEUTROABS 6.3  --   --   --  3.3  HGB 12.4* 12.6* 11.8* 10.9* 9.6*  HCT 36.6* 36.8* 35.1* 32.6* 28.3*  MCV 85.3  --  85.6  --  85.5  PLT 233  --  248  --  115   Basic Metabolic Panel: Recent Labs  Lab 02/02/21 1725 02/03/21 0040 02/04/21 0446  NA 137 141 140  K 4.8 3.8 3.2*  CL 101 106 110  CO2 $Re'27 23 24  'YoM$ GLUCOSE 295* 185* 153*  BUN 24* 20 20  CREATININE 0.95 0.95 0.83  CALCIUM 9.5 9.4 7.5*  MG  --  2.0  --    GFR: Estimated Creatinine Clearance: 148.8 mL/min (by C-G formula based on SCr of 0.83 mg/dL). Liver Function Tests: Recent Labs  Lab 02/02/21 1725 02/03/21 0040  AST 25 22  ALT 71* 63*  ALKPHOS 63 60  BILITOT 0.7 0.5  PROT 7.4 6.9  ALBUMIN 4.2 3.9   No results for input(s): LIPASE, AMYLASE in the last 168 hours. No results for input(s): AMMONIA in the last 168 hours. Coagulation Profile: Recent Labs  Lab 02/02/21 1725 02/03/21 0040  INR 1.0 1.0   Cardiac  Enzymes: No results for input(s): CKTOTAL, CKMB, CKMBINDEX, TROPONINI in the last 168 hours. BNP (last 3 results) No results for input(s): PROBNP in the last 8760 hours. HbA1C: No results for input(s): HGBA1C in the last 72 hours. CBG: No results for input(s): GLUCAP  in the last 168 hours. Lipid Profile: No results for input(s): CHOL, HDL, LDLCALC, TRIG, CHOLHDL, LDLDIRECT in the last 72 hours. Thyroid Function Tests: No results for input(s): TSH, T4TOTAL, FREET4, T3FREE, THYROIDAB in the last 72 hours. Anemia Panel: Recent Labs    02/03/21 0113 02/03/21 0459  FOLATE  --  18.7  FERRITIN  --  118  TIBC  --  369  IRON  --  120  RETICCTPCT 1.6  --    Sepsis Labs: No results for input(s): PROCALCITON, LATICACIDVEN in the last 168 hours.  Recent Results (from the past 240 hour(s))  Resp Panel by RT-PCR (Flu A&B, Covid) Nasopharyngeal Swab     Status: None   Collection Time: 02/02/21  6:48 PM   Specimen: Nasopharyngeal Swab; Nasopharyngeal(NP) swabs in vial transport medium  Result Value Ref Range Status   SARS Coronavirus 2 by RT PCR NEGATIVE NEGATIVE Final    Comment: (NOTE) SARS-CoV-2 target nucleic acids are NOT DETECTED.  The SARS-CoV-2 RNA is generally detectable in upper respiratory specimens during the acute phase of infection. The lowest concentration of SARS-CoV-2 viral copies this assay can detect is 138 copies/mL. A negative result does not preclude SARS-Cov-2 infection and should not be used as the sole basis for treatment or other patient management decisions. A negative result may occur with  improper specimen collection/handling, submission of specimen other than nasopharyngeal swab, presence of viral mutation(s) within the areas targeted by this assay, and inadequate number of viral copies(<138 copies/mL). A negative result must be combined with clinical observations, patient history, and epidemiological information. The expected result is Negative.  Fact  Sheet for Patients:  EntrepreneurPulse.com.au  Fact Sheet for Healthcare Providers:  IncredibleEmployment.be  This test is no t yet approved or cleared by the [place] FDA and  has been authorized for detection and/or diagnosis of SARS-CoV-2 by FDA under an Emergency Use Authorization (EUA). This EUA will remain  in effect (meaning this test can be used) for the duration of the COVID-19 declaration under Section 564(b)(1) of the Act, 21 U.S.C.section 360bbb-3(b)(1), unless the authorization is terminated  or revoked sooner.       Influenza A by PCR NEGATIVE NEGATIVE Final   Influenza B by PCR NEGATIVE NEGATIVE Final    Comment: (NOTE) The Xpert Xpress SARS-CoV-2/FLU/RSV plus assay is intended as an aid in the diagnosis of influenza from Nasopharyngeal swab specimens and should not be used as a sole basis for treatment. Nasal washings and aspirates are unacceptable for Xpert Xpress SARS-CoV-2/FLU/RSV testing.  Fact Sheet for Patients: EntrepreneurPulse.com.au  Fact Sheet for Healthcare Providers: IncredibleEmployment.be  This test is not yet approved or cleared by the [place] FDA and has been authorized for detection and/or diagnosis of SARS-CoV-2 by FDA under an Emergency Use Authorization (EUA). This EUA will remain in effect (meaning this test can be used) for the duration of the COVID-19 declaration under Section 564(b)(1) of the Act, 21 U.S.C. section 360bbb-3(b)(1), unless the authorization is terminated or revoked.  Performed at [facility], [place] [address], [place], [place] [address]          Radiology Studies: DG Chest 2 View  Result Date: 02/02/2021 CLINICAL DATA:  Shortness of breath EXAM: CHEST - 2 VIEW COMPARISON:  04/05/2015 FINDINGS: The heart size and mediastinal contours are within normal limits. Both lungs are clear. The visualized skeletal structures  are unremarkable. IMPRESSION: No active cardiopulmonary disease. Electronically Signed   By: Inez Catalina M.D.   On:  02/02/2021 16:37        Scheduled Meds: . pantoprazole (PROTONIX) IV  40 mg Intravenous Q12H   Continuous Infusions: . sodium chloride 75 mL/hr at 02/04/21 0047     Time spent: 25 minutes with over 50% of the time coordinating the patient's care    Harold Hedge, DO [facility]   Call night coverage person covering after 7pm

## 2021-02-04 NOTE — Progress Notes (Signed)
[facility] Progress Note  Derek Lawson 35 y.o. January 30, 1986  CC:  Anemia, melena  Subjective: Patient reports one formed black stool today.  Denies nausea, vomiting, or abdominal pain.  Tolerated grits.  ROS : Review of Systems  Cardiovascular: Negative for chest pain and palpitations.  Gastrointestinal: Positive for melena. Negative for abdominal pain, blood in stool, constipation, diarrhea, heartburn, nausea and vomiting.    Objective: Vital signs in last 24 hours: Vitals:   02/03/21 2042 02/04/21 0439  BP: 128/81 128/80  Pulse: 75 74  Resp: 18 16  Temp: 98.1 F (36.7 C) 97.7 F (36.5 C)  SpO2: 97% 98%    Physical Exam:  General:  Alert, oriented, cooperative, no distress  Head:  Normocephalic, without obvious abnormality, atraumatic  Eyes:  Anicteric sclera, EOMs intact   Lungs:   Clear to auscultation bilaterally, respirations unlabored  Heart:  Regular rate and rhythm, S1, S2 normal  Abdomen:   Soft, non-tender, non-distended, bowel sounds active all four quadrants  Extremities: Extremities normal, atraumatic, no  edema  Pulses: 2+ and symmetric    Lab Results: Recent Labs    02/03/21 0040 02/04/21 0446  NA 141 140  K 3.8 3.2*  CL 106 110  CO2 23 24  GLUCOSE 185* 153*  BUN 20 20  CREATININE 0.95 0.83  CALCIUM 9.4 7.5*  MG 2.0  --    Recent Labs    02/02/21 1725 02/03/21 0040  AST 25 22  ALT 71* 63*  ALKPHOS 63 60  BILITOT 0.7 0.5  PROT 7.4 6.9  ALBUMIN 4.2 3.9   Recent Labs    02/02/21 1725 02/02/21 2107 02/03/21 0040 02/03/21 0909 02/04/21 0549  WBC 8.9  --  8.7  --  6.7  NEUTROABS 6.3  --   --   --  3.3  HGB 12.4*   < > 11.8* 10.9* 9.6*  HCT 36.6*   < > 35.1* 32.6* 28.3*  MCV 85.3  --  85.6  --  85.5  PLT 233  --  248  --  198   < > = values in this interval not displayed.   Recent Labs    02/02/21 1725 02/03/21 0040  LABPROT 13.1 13.3  INR 1.0 1.0      Assessment: Anemia, melena: Probable caustic  gastritis per EGD yesterday.  One formed black BM today, likely residual blood. -Hgb 9.6, decreased from 10.9 yesterday, likely dilutional -BUN 20/ Cr 0.83  Plan: Discontinue NSAIDs.  Continue Protonix 40 mg once daily for 1-2 months.  Repeat CBC in 1 week.  FU in GI office in 1-2 months.  OK to discharge from a GI standpoint.  [facility] will sign off.  Please contact us if we can be of any further assistance during this hospital stay.  Edrick Kins PA-C 02/04/2021, 12:24 PM  Contact #  [phone]

## 2021-02-05 ENCOUNTER — Emergency Department (HOSPITAL_COMMUNITY): Payer: BC Managed Care – PPO

## 2021-02-05 ENCOUNTER — Encounter (HOSPITAL_COMMUNITY): Payer: Self-pay

## 2021-02-05 ENCOUNTER — Encounter (HOSPITAL_COMMUNITY)
Admission: EM | Disposition: A | Discharge status: Home / Self Care | Payer: Self-pay | Source: Home / Self Care | Attending: Internal Medicine

## 2021-02-05 ENCOUNTER — Inpatient Hospital Stay (HOSPITAL_COMMUNITY)
Admission: EM | Admit: 2021-02-05 | Discharge: 2021-02-08 | DRG: 378 | Disposition: A | Discharge status: Home / Self Care | Payer: BC Managed Care – PPO | Attending: Internal Medicine | Admitting: Internal Medicine

## 2021-02-05 ENCOUNTER — Other Ambulatory Visit: Payer: Self-pay

## 2021-02-05 ENCOUNTER — Inpatient Hospital Stay (HOSPITAL_COMMUNITY): Payer: BC Managed Care – PPO | Admitting: Registered Nurse

## 2021-02-05 DIAGNOSIS — Z91018 Allergy to other foods: Secondary | ICD-10-CM

## 2021-02-05 DIAGNOSIS — E1165 Type 2 diabetes mellitus with hyperglycemia: Secondary | ICD-10-CM | POA: Diagnosis present

## 2021-02-05 DIAGNOSIS — Z882 Allergy status to sulfonamides status: Secondary | ICD-10-CM | POA: Diagnosis not present

## 2021-02-05 DIAGNOSIS — T39395A Adverse effect of other nonsteroidal anti-inflammatory drugs [NSAID], initial encounter: Secondary | ICD-10-CM | POA: Diagnosis present

## 2021-02-05 DIAGNOSIS — K449 Diaphragmatic hernia without obstruction or gangrene: Secondary | ICD-10-CM | POA: Diagnosis present

## 2021-02-05 DIAGNOSIS — E1159 Type 2 diabetes mellitus with other circulatory complications: Secondary | ICD-10-CM | POA: Insufficient documentation

## 2021-02-05 DIAGNOSIS — E1169 Type 2 diabetes mellitus with other specified complication: Secondary | ICD-10-CM | POA: Insufficient documentation

## 2021-02-05 DIAGNOSIS — E669 Obesity, unspecified: Secondary | ICD-10-CM

## 2021-02-05 DIAGNOSIS — Z87891 Personal history of nicotine dependence: Secondary | ICD-10-CM | POA: Diagnosis not present

## 2021-02-05 DIAGNOSIS — K922 Gastrointestinal hemorrhage, unspecified: Secondary | ICD-10-CM | POA: Diagnosis not present

## 2021-02-05 DIAGNOSIS — K2961 Other gastritis with bleeding: Secondary | ICD-10-CM | POA: Diagnosis present

## 2021-02-05 DIAGNOSIS — Z20822 Contact with and (suspected) exposure to covid-19: Secondary | ICD-10-CM | POA: Diagnosis present

## 2021-02-05 DIAGNOSIS — M25572 Pain in left ankle and joints of left foot: Secondary | ICD-10-CM | POA: Diagnosis present

## 2021-02-05 DIAGNOSIS — E1365 Other specified diabetes mellitus with hyperglycemia: Secondary | ICD-10-CM | POA: Diagnosis not present

## 2021-02-05 DIAGNOSIS — M109 Gout, unspecified: Secondary | ICD-10-CM | POA: Diagnosis present

## 2021-02-05 DIAGNOSIS — Z833 Family history of diabetes mellitus: Secondary | ICD-10-CM

## 2021-02-05 DIAGNOSIS — I152 Hypertension secondary to endocrine disorders: Secondary | ICD-10-CM | POA: Insufficient documentation

## 2021-02-05 DIAGNOSIS — Z91013 Allergy to seafood: Secondary | ICD-10-CM | POA: Diagnosis not present

## 2021-02-05 DIAGNOSIS — D62 Acute posthemorrhagic anemia: Secondary | ICD-10-CM | POA: Diagnosis present

## 2021-02-05 DIAGNOSIS — J4520 Mild intermittent asthma, uncomplicated: Secondary | ICD-10-CM | POA: Diagnosis present

## 2021-02-05 DIAGNOSIS — R55 Syncope and collapse: Secondary | ICD-10-CM | POA: Diagnosis present

## 2021-02-05 DIAGNOSIS — I10 Essential (primary) hypertension: Secondary | ICD-10-CM | POA: Diagnosis present

## 2021-02-05 DIAGNOSIS — K254 Chronic or unspecified gastric ulcer with hemorrhage: Principal | ICD-10-CM | POA: Diagnosis present

## 2021-02-05 DIAGNOSIS — Z7952 Long term (current) use of systemic steroids: Secondary | ICD-10-CM | POA: Diagnosis not present

## 2021-02-05 HISTORY — PX: HOT HEMOSTASIS: SHX5433

## 2021-02-05 HISTORY — PX: ESOPHAGOGASTRODUODENOSCOPY: SHX5428

## 2021-02-05 HISTORY — PX: SCLEROTHERAPY: SHX6841

## 2021-02-05 LAB — CBC WITH DIFFERENTIAL/PLATELET
Abs Immature Granulocytes: 0.2 10*3/uL — ABNORMAL HIGH (ref 0.00–0.07)
Basophils Absolute: 0 10*3/uL (ref 0.0–0.1)
Basophils Relative: 0 %
Eosinophils Absolute: 0.1 10*3/uL (ref 0.0–0.5)
Eosinophils Relative: 1 %
HCT: 21 % — ABNORMAL LOW (ref 39.0–52.0)
Hemoglobin: 6.9 g/dL — CL (ref 13.0–17.0)
Immature Granulocytes: 2 %
Lymphocytes Relative: 21 %
Lymphs Abs: 2.8 10*3/uL (ref 0.7–4.0)
MCH: 29.1 pg (ref 26.0–34.0)
MCHC: 32.9 g/dL (ref 30.0–36.0)
MCV: 88.6 fL (ref 80.0–100.0)
Monocytes Absolute: 0.8 10*3/uL (ref 0.1–1.0)
Monocytes Relative: 6 %
Neutro Abs: 9.3 10*3/uL — ABNORMAL HIGH (ref 1.7–7.7)
Neutrophils Relative %: 70 %
Platelets: 230 10*3/uL (ref 150–400)
RBC: 2.37 MIL/uL — ABNORMAL LOW (ref 4.22–5.81)
RDW: 12.7 % (ref 11.5–15.5)
WBC: 13.2 10*3/uL — ABNORMAL HIGH (ref 4.0–10.5)
nRBC: 0 % (ref 0.0–0.2)

## 2021-02-05 LAB — TYPE AND SCREEN
ABO/RH(D): O POS
Antibody Screen: NEGATIVE

## 2021-02-05 LAB — RESP PANEL BY RT-PCR (FLU A&B, COVID) ARPGX2
Influenza A by PCR: NEGATIVE
Influenza B by PCR: NEGATIVE
SARS Coronavirus 2 by RT PCR: NEGATIVE

## 2021-02-05 LAB — BASIC METABOLIC PANEL
Anion gap: 6 (ref 5–15)
BUN: 32 mg/dL — ABNORMAL HIGH (ref 6–20)
CO2: 25 mmol/L (ref 22–32)
Calcium: 8.3 mg/dL — ABNORMAL LOW (ref 8.9–10.3)
Chloride: 109 mmol/L (ref 98–111)
Creatinine, Ser: 1.12 mg/dL (ref 0.61–1.24)
GFR, Estimated: >60 mL/min (ref >60)
Glucose, Bld: 333 mg/dL — ABNORMAL HIGH (ref 70–99)
Potassium: 4.7 mmol/L (ref 3.5–5.1)
Sodium: 140 mmol/L (ref 135–145)

## 2021-02-05 LAB — PREPARE RBC (CROSSMATCH)

## 2021-02-05 LAB — MRSA PCR SCREENING: MRSA by PCR: POSITIVE — AB

## 2021-02-05 SURGERY — EGD (ESOPHAGOGASTRODUODENOSCOPY)
Anesthesia: General

## 2021-02-05 MED ORDER — SUCCINYLCHOLINE CHLORIDE 200 MG/10ML IV SOSY
PREFILLED_SYRINGE | INTRAVENOUS | Status: DC | PRN
  Administered 2021-02-05: 120 mg via INTRAVENOUS

## 2021-02-05 MED ORDER — SUGAMMADEX SODIUM 200 MG/2ML IV SOLN
INTRAVENOUS | Status: DC | PRN
  Administered 2021-02-05: 200 mg via INTRAVENOUS

## 2021-02-05 MED ORDER — PROPOFOL 10 MG/ML IV BOLUS
INTRAVENOUS | Status: AC
  Filled 2021-02-05: qty 20

## 2021-02-05 MED ORDER — SODIUM CHLORIDE 0.9 % IV SOLN
80.0000 mg | Freq: Once | INTRAVENOUS | Status: AC
  Administered 2021-02-05: 80 mg via INTRAVENOUS
  Filled 2021-02-05: qty 80

## 2021-02-05 MED ORDER — SODIUM CHLORIDE (PF) 0.9 % IJ SOLN
PREFILLED_SYRINGE | INTRAMUSCULAR | Status: DC | PRN
  Administered 2021-02-05: 3 mL

## 2021-02-05 MED ORDER — LACTATED RINGERS IV SOLN: INTRAVENOUS | Status: DC

## 2021-02-05 MED ORDER — ALBUTEROL SULFATE HFA 108 (90 BASE) MCG/ACT IN AERS
1.0000 | INHALATION_SPRAY | Freq: Four times a day (QID) | RESPIRATORY_TRACT | Status: DC | PRN
  Filled 2021-02-05: qty 6.7

## 2021-02-05 MED ORDER — ROCURONIUM BROMIDE 10 MG/ML (PF) SYRINGE
PREFILLED_SYRINGE | INTRAVENOUS | Status: DC | PRN
  Administered 2021-02-05: 20 mg via INTRAVENOUS

## 2021-02-05 MED ORDER — MUPIROCIN 2 % EX OINT
TOPICAL_OINTMENT | Freq: Two times a day (BID) | CUTANEOUS | Status: DC
  Administered 2021-02-08: 1 via NASAL
  Filled 2021-02-05 (×2): qty 22

## 2021-02-05 MED ORDER — PROPOFOL 10 MG/ML IV BOLUS
INTRAVENOUS | Status: DC | PRN
  Administered 2021-02-05: 200 mg via INTRAVENOUS

## 2021-02-05 MED ORDER — LIDOCAINE 2% (20 MG/ML) 5 ML SYRINGE
INTRAMUSCULAR | Status: DC | PRN
  Administered 2021-02-05: 100 mg via INTRAVENOUS

## 2021-02-05 MED ORDER — ALBUTEROL SULFATE HFA 108 (90 BASE) MCG/ACT IN AERS
INHALATION_SPRAY | RESPIRATORY_TRACT | Status: DC | PRN
  Administered 2021-02-05: 3 via RESPIRATORY_TRACT

## 2021-02-05 MED ORDER — ONDANSETRON HCL 4 MG/2ML IJ SOLN: 4.0000 mg | Freq: Four times a day (QID) | INTRAMUSCULAR | Status: DC | PRN

## 2021-02-05 MED ORDER — SODIUM CHLORIDE 0.9 % IV SOLN
10.0000 mL/h | Freq: Once | INTRAVENOUS | Status: AC
  Administered 2021-02-05: 10 mL/h via INTRAVENOUS

## 2021-02-05 MED ORDER — SODIUM CHLORIDE 0.9 % IV SOLN: 250.0000 mL | INTRAVENOUS | Status: DC | PRN

## 2021-02-05 MED ORDER — CHLORHEXIDINE GLUCONATE CLOTH 2 % EX PADS
6.0000 | MEDICATED_PAD | Freq: Every day | CUTANEOUS | Status: DC
  Administered 2021-02-05 – 2021-02-07 (×3): 6 via TOPICAL

## 2021-02-05 MED ORDER — LACTATED RINGERS IV BOLUS
1000.0000 mL | Freq: Once | INTRAVENOUS | Status: AC
  Administered 2021-02-05: 1000 mL via INTRAVENOUS

## 2021-02-05 MED ORDER — ESMOLOL HCL 100 MG/10ML IV SOLN
INTRAVENOUS | Status: DC | PRN
  Administered 2021-02-05: 30 mg via INTRAVENOUS

## 2021-02-05 MED ORDER — PROPOFOL 1000 MG/100ML IV EMUL
INTRAVENOUS | Status: AC
  Filled 2021-02-05: qty 100

## 2021-02-05 MED ORDER — SODIUM CHLORIDE 0.9% FLUSH: 3.0000 mL | INTRAVENOUS | Status: DC | PRN

## 2021-02-05 MED ORDER — METOCLOPRAMIDE HCL 5 MG/ML IJ SOLN
10.0000 mg | Freq: Once | INTRAMUSCULAR | Status: AC
  Administered 2021-02-05: 10 mg via INTRAVENOUS
  Filled 2021-02-05: qty 2

## 2021-02-05 MED ORDER — SODIUM CHLORIDE 0.9% FLUSH
3.0000 mL | Freq: Two times a day (BID) | INTRAVENOUS | Status: DC
  Administered 2021-02-05 – 2021-02-06 (×3): 3 mL via INTRAVENOUS

## 2021-02-05 MED ORDER — SODIUM CHLORIDE 0.9 % IV SOLN
8.0000 mg/h | INTRAVENOUS | Status: AC
  Administered 2021-02-05 – 2021-02-07 (×7): 8 mg/h via INTRAVENOUS
  Filled 2021-02-05 (×12): qty 80

## 2021-02-05 MED ORDER — ONDANSETRON HCL 4 MG/2ML IJ SOLN
INTRAMUSCULAR | Status: DC | PRN
  Administered 2021-02-05: 4 mg via INTRAVENOUS

## 2021-02-05 MED ORDER — EPINEPHRINE 1 MG/10ML IJ SOSY
PREFILLED_SYRINGE | INTRAMUSCULAR | Status: AC
  Filled 2021-02-05: qty 10

## 2021-02-05 MED ORDER — ONDANSETRON HCL 4 MG PO TABS: 4.0000 mg | ORAL_TABLET | Freq: Four times a day (QID) | ORAL | Status: DC | PRN

## 2021-02-05 NOTE — Plan of Care (Signed)

## 2021-02-05 NOTE — Consult Note (Signed)
Referring Provider: ED Primary Care Physician:  Patient, No Pcp Per (Inactive)  Reason for Consultation:  Upper GI Bleeding  HPI: Derek Lawson is a 35 y.o. male with history of asthma and recent upper GI bleeding presenting for consultation of upper GI bleeding.  Patient was recently admitted for upper GI bleeding from 4/18-4/20.  EGD 02/03/21 revealed probably caustic gastritis.  He was discharged last evening with no further signs of GI bleeding.  Arouns 05:00 this morning, he became dizzy and had a syncopal episode.  He states he also started having liquid watery stools.  He re-presented to the ED.  When he was taken for his CT scan, he vomiting red blood and clots.  He reports weakness and fatigue.  Denies abdominal pain.  Past Medical History:  Diagnosis Date  . Asthma   . Hypertension     History reviewed. No pertinent surgical history.  Prior to Admission medications   Medication Sig Start Date End Date Taking? Authorizing Provider  albuterol (VENTOLIN HFA) 108 (90 Base) MCG/ACT inhaler Inhale 1-2 puffs into the lungs every 6 (six) hours as needed for wheezing or shortness of breath. 10/01/20   Mardella Layman, MD  cetirizine (ZYRTEC) 10 MG chewable tablet Chew 10 mg by mouth daily.    [provider]  lisinopril (ZESTRIL) 10 MG tablet Take 1 tablet (10 mg total) by mouth daily. 02/04/21   Jae Dire, MD  Multiple Vitamin (MULTIVITAMIN WITH MINERALS) TABS tablet Take 1 tablet by mouth daily.    [provider]  pantoprazole (PROTONIX) 40 MG tablet Take 1 tablet (40 mg total) by mouth daily. 02/04/21 03/06/21  Jae Dire, MD  ALBUTEROL IN Inhale 2 puffs into the lungs 4 (four) times daily as needed.   08/10/14  [provider]  atorvastatin (LIPITOR) 20 MG tablet Take 1 tablet (20 mg total) by mouth daily. 04/16/16 02/22/20  Massie Maroon, FNP  montelukast (SINGULAIR) 10 MG tablet Take 1 tablet (10 mg total) by mouth at bedtime. 04/15/16 02/22/20   Massie Maroon, FNP    Scheduled Meds: Continuous Infusions: . sodium chloride    . pantoprozole (PROTONIX) infusion    . pantoprazole (PROTONIX) 80 mg IVPB     PRN Meds:.  Allergies as of 02/05/2021 - Review Complete 02/05/2021  Allergen Reaction Noted  . Charentais melon (french melon) Hives 02/22/2020  . Shellfish allergy Hives 02/22/2020  . Sulfa antibiotics Other (See Comments) 02/02/2021    Family History  Problem Relation Age of Onset  . Diabetes Father     Social History   Socioeconomic History  . Marital status: Married    Spouse name: Not on file  . Number of children: Not on file  . Years of education: Not on file  . Highest education level: Not on file  Occupational History  . Not on file  Tobacco Use  . Smoking status: Former Smoker    Packs/day: 0.50    Years: 0.50    Pack years: 0.25    Types: Cigarettes    Quit date: 08/10/2013    Years since quitting: 7.4  . Smokeless tobacco: Never Used  Substance and Sexual Activity  . Alcohol use: Not Currently    Comment: socially  . Drug use: No  . Sexual activity: Not on file  Other Topics Concern  . Not on file  Social History Narrative  . Not on file   Social Determinants of Health   Financial Resource Strain: Not on  file  Food Insecurity: Not on file  Transportation Needs: Not on file  Physical Activity: Not on file  Stress: Not on file  Social Connections: Not on file  Intimate Partner Violence: Not on file    Review of Systems: Review of Systems  Constitutional: Positive for malaise/fatigue. Negative for fever.  HENT: Negative for hearing loss and tinnitus.   Eyes: Negative for pain and redness.  Respiratory: Negative for cough and shortness of breath.   Cardiovascular: Negative for chest pain and palpitations.  Gastrointestinal: Positive for diarrhea, melena, nausea and vomiting. Negative for abdominal pain, blood in stool, constipation and heartburn.  Genitourinary: Negative for  flank pain and hematuria.  Musculoskeletal: Positive for back pain and falls.  Skin: Negative for itching and rash.  Neurological: Positive for loss of consciousness and weakness.  Endo/Heme/Allergies: Negative for polydipsia. Does not bruise/bleed easily.  Psychiatric/Behavioral: Negative for substance abuse. The patient is not nervous/anxious.      Physical Exam: Vital signs: Vitals:   02/05/21 0730 02/05/21 0800  BP: 123/79 135/70  Pulse: (!) 103 (!) 116  Resp: 18 20  Temp:    SpO2: 100% 100%     Physical Exam Vitals reviewed.  Constitutional:      General: He is not in acute distress. HENT:     Head: Normocephalic and atraumatic.     Nose: Nose normal. No congestion.     Mouth/Throat:     Mouth: Mucous membranes are moist.     Pharynx: Oropharynx is clear.  Eyes:     Extraocular Movements: Extraocular movements intact.     Comments: Conjunctival pallor  Cardiovascular:     Rate and Rhythm: Regular rhythm. Tachycardia present.     Pulses: Normal pulses.  Pulmonary:     Effort: Pulmonary effort is normal. No respiratory distress.  Abdominal:     General: Bowel sounds are normal. There is no distension.     Palpations: Abdomen is soft. There is no mass.     Tenderness: There is no abdominal tenderness. There is no guarding or rebound.     Hernia: No hernia is present.  Musculoskeletal:        General: No swelling or tenderness.     Cervical back: Normal range of motion and neck supple.  Neurological:     General: No focal deficit present.     Mental Status: He is oriented to person, place, and time. He is lethargic.  Psychiatric:        Mood and Affect: Mood normal.        Behavior: Behavior normal. Behavior is cooperative.     GI:  Lab Results: Recent Labs    02/03/21 0040 02/03/21 0909 02/04/21 0549 02/05/21 0724  WBC 8.7  --  6.7 13.2*  HGB 11.8* 10.9* 9.6* 6.9*  HCT 35.1* 32.6* 28.3* 21.0*  PLT 248  --  198 230   BMET Recent Labs     02/03/21 0040 02/04/21 0446 02/05/21 0724  NA 141 140 140  K 3.8 3.2* 4.7  CL 106 110 109  CO2 23 24 25   GLUCOSE 185* 153* 333*  BUN 20 20 32*  CREATININE 0.95 0.83 1.12  CALCIUM 9.4 7.5* 8.3*   LFT Recent Labs    02/03/21 0040  PROT 6.9  ALBUMIN 3.9  AST 22  ALT 63*  ALKPHOS 60  BILITOT 0.5   PT/INR Recent Labs    02/02/21 1725 02/03/21 0040  LABPROT 13.1 13.3  INR 1.0 1.0  Studies/Results: DG Lumbar Spine 2-3 Views  Result Date: 02/05/2021 CLINICAL DATA:  Fall with lumbar spine pain and tenderness. EXAM: LUMBAR SPINE - 2-3 VIEW COMPARISON:  None. FINDINGS: There is no evidence of lumbar spine fracture. Alignment is normal. Intervertebral disc spaces are maintained. IMPRESSION: Negative. Electronically Signed   By: Kennith Center M.D.   On: 02/05/2021 08:26   CT Head Wo Contrast  Result Date: 02/05/2021 CLINICAL DATA:  Head trauma, lethargy, became dizzy and had syncopal episode/fall in the bathroom after waking up this morning. EXAM: CT HEAD WITHOUT CONTRAST TECHNIQUE: Contiguous axial images were obtained from the base of the skull through the vertex without intravenous contrast. Sagittal and coronal MPR images reconstructed from axial data set. COMPARISON:  None FINDINGS: Brain: Normal ventricular morphology. No midline shift or mass effect. Normal appearance of brain parenchyma. No intracranial hemorrhage, mass lesion, evidence of acute infarction, or extra-axial fluid collection. Vascular: No hyperdense vessels Skull: Intact Sinuses/Orbits: Clear Other: N/A IMPRESSION: Normal exam. Electronically Signed   By: Ulyses Southward M.D.   On: 02/05/2021 08:53    Impression: Upper GI Bleeding -Hgb 6.9, decreased from 9.6 yesterday -BUN 32, increased from 20 yesterday, suggestive of upper GI bleeding  Plan: Repeat EGD today.  NPO.  Continue Protonix drip.  Patient has 1u pRBCs ordered.  Continue to monitor H&H with transfusion as needed to maintain Hgb >7.  [facility] will follow.   LOS: 0 days   Edrick Kins  PA-C 02/05/2021, 9:04 AM  Contact #  [phone]

## 2021-02-05 NOTE — H&P (Signed)
History and Physical    Derek Lawson:[id number] DOB: 06-Jul-1986 DOA: 02/05/2021  PCP: Patient, No Pcp Per (Inactive)  Patient coming from: Home  Chief Complaint: Passed out  HPI: Derek Lawson is a 35 y.o. male with medical history significant of hypertension and asthma just discharged yesterday with GI bleed.  Patient and wife reports that he was feeling normal yesterday was not having any symptoms he was still having some melena but not a lot.  He had a EGD which showed gastritis but nothing else acute.  He did not require blood transfusion.  His hemoglobin yesterday was 9.6.  Patient went home continued to have melanotic stools.  He got up this morning and got very dizzy and passed out in the bedroom.  He denies any nausea or vomiting or any vomiting blood however he did have an episode in the ED.  There is no vomiting recurrence after Zofran.  Patient's hemoglobin dropped to 6.9 since yesterday.  GI has been consulted and will be seeing patient in consultation.   Review of Systems: As per HPI otherwise 10 point review of systems negative.   Past Medical History:  Diagnosis Date  . Asthma   . Hypertension     History reviewed. No pertinent surgical history.   reports that he quit smoking about 7 years ago. His smoking use included cigarettes. He has a 0.25 pack-year smoking history. He has never used smokeless tobacco. He reports previous alcohol use. He reports that he does not use drugs.  Allergies  Allergen Reactions  . Charentais Melon (French Melon) Hives    Honeydew melon   . Shellfish Allergy Hives  . Sulfa Antibiotics Other (See Comments)    unknown    Family History  Problem Relation Age of Onset  . Diabetes Father     Prior to Admission medications   Medication Sig Start Date End Date Taking? Authorizing Provider  albuterol (VENTOLIN HFA) 108 (90 Base) MCG/ACT inhaler Inhale 1-2 puffs into the lungs every 6 (six) hours as needed for wheezing or  shortness of breath. 10/01/20   Mardella Layman, MD  cetirizine (ZYRTEC) 10 MG chewable tablet Chew 10 mg by mouth daily.    [provider]  lisinopril (ZESTRIL) 10 MG tablet Take 1 tablet (10 mg total) by mouth daily. 02/04/21   Jae Dire, MD  Multiple Vitamin (MULTIVITAMIN WITH MINERALS) TABS tablet Take 1 tablet by mouth daily.    [provider]  pantoprazole (PROTONIX) 40 MG tablet Take 1 tablet (40 mg total) by mouth daily. 02/04/21 03/06/21  Jae Dire, MD  ALBUTEROL IN Inhale 2 puffs into the lungs 4 (four) times daily as needed.   08/10/14  [provider]  atorvastatin (LIPITOR) 20 MG tablet Take 1 tablet (20 mg total) by mouth daily. 04/16/16 02/22/20  Massie Maroon, FNP  montelukast (SINGULAIR) 10 MG tablet Take 1 tablet (10 mg total) by mouth at bedtime. 04/15/16 02/22/20  Massie Maroon, FNP    Physical Exam: Vitals:   02/05/21 0648 02/05/21 0649 02/05/21 0730 02/05/21 0800  BP: 131/90  123/79 135/70  Pulse: (!) 110  (!) 103 (!) 116  Resp: 17  18 20   Temp: 97.8 F (36.6 C)     TempSrc: Oral     SpO2: 97%  100% 100%  Weight:  104.3 kg    Height:  5\' 10"  (1.778 m)        Constitutional: NAD, calm, comfortable Vitals:   02/05/21  [id number] 02/05/21 0649 02/05/21 0730 02/05/21 0800  BP: 131/90  123/79 135/70  Pulse: (!) 110  (!) 103 (!) 116  Resp: 17  18 20   Temp: 97.8 F (36.6 C)     TempSrc: Oral     SpO2: 97%  100% 100%  Weight:  104.3 kg    Height:  5\' 10"  (1.778 m)     Eyes: PERRL, lids and conjunctivae normal ENMT: Mucous membranes are moist. Posterior pharynx clear of any exudate or lesions.Normal dentition.  Neck: normal, supple, no masses, no thyromegaly Respiratory: clear to auscultation bilaterally, no wheezing, no crackles. Normal respiratory effort. No accessory muscle use.  Cardiovascular: Regular rate and rhythm, no murmurs / rubs / gallops. No extremity edema. 2+ pedal pulses. No carotid bruits.  Abdomen: no tenderness,  no masses palpated. No hepatosplenomegaly. Bowel sounds positive.  Musculoskeletal: no clubbing / cyanosis. No joint deformity upper and lower extremities. Good ROM, no contractures. Normal muscle tone.  Skin: no rashes, lesions, ulcers. No induration Neurologic: CN 2-12 grossly intact. Sensation intact, DTR normal. Strength 5/5 in all 4.  Psychiatric: Normal judgment and insight. Alert and oriented x 3. Normal mood.    Labs on Admission: I have personally reviewed following labs and imaging studies  CBC: Recent Labs  Lab 02/02/21 1725 02/02/21 2107 02/03/21 0040 02/03/21 0909 02/04/21 0549 02/05/21 0724  WBC 8.9  --  8.7  --  6.7 13.2*  NEUTROABS 6.3  --   --   --  3.3 9.3*  HGB 12.4* 12.6* 11.8* 10.9* 9.6* 6.9*  HCT 36.6* 36.8* 35.1* 32.6* 28.3* 21.0*  MCV 85.3  --  85.6  --  85.5 88.6  PLT 233  --  248  --  198 230   Basic Metabolic Panel: Recent Labs  Lab 02/02/21 1725 02/03/21 0040 02/04/21 0446 02/05/21 0724  NA 137 141 140 140  K 4.8 3.8 3.2* 4.7  CL 101 106 110 109  CO2 27 23 24 25   GLUCOSE 295* 185* 153* 333*  BUN 24* 20 20 32*  CREATININE 0.95 0.95 0.83 1.12  CALCIUM 9.5 9.4 7.5* 8.3*  MG  --  2.0  --   --    GFR: Estimated Creatinine Clearance: 110.3 mL/min (by C-G formula based on SCr of 1.12 mg/dL). Liver Function Tests: Recent Labs  Lab 02/02/21 1725 02/03/21 0040  AST 25 22  ALT 71* 63*  ALKPHOS 63 60  BILITOT 0.7 0.5  PROT 7.4 6.9  ALBUMIN 4.2 3.9   No results for input(s): LIPASE, AMYLASE in the last 168 hours. No results for input(s): AMMONIA in the last 168 hours. Coagulation Profile: Recent Labs  Lab 02/02/21 1725 02/03/21 0040  INR 1.0 1.0   Cardiac Enzymes: No results for input(s): CKTOTAL, CKMB, CKMBINDEX, TROPONINI in the last 168 hours. BNP (last 3 results) No results for input(s): PROBNP in the last 8760 hours. HbA1C: No results for input(s): HGBA1C in the last 72 hours. CBG: No results for input(s): GLUCAP in the last  168 hours. Lipid Profile: No results for input(s): CHOL, HDL, LDLCALC, TRIG, CHOLHDL, LDLDIRECT in the last 72 hours. Thyroid Function Tests: No results for input(s): TSH, T4TOTAL, FREET4, T3FREE, THYROIDAB in the last 72 hours. Anemia Panel: Recent Labs    02/03/21 0113 02/03/21 0459  FOLATE  --  18.7  FERRITIN  --  118  TIBC  --  369  IRON  --  120  RETICCTPCT 1.6  --    Urine analysis:  Component Value Date/Time   COLORURINE YELLOW 09/08/2015 1136   APPEARANCEUR CLEAR 09/08/2015 1136   LABSPEC 1.025 08/18/2020 1748   PHURINE 6.0 08/18/2020 1748   GLUCOSEU NEGATIVE 08/18/2020 1748   HGBUR NEGATIVE 08/18/2020 1748   BILIRUBINUR NEGATIVE 08/18/2020 1748   KETONESUR TRACE (A) 08/18/2020 1748   PROTEINUR 30 (A) 08/18/2020 1748   UROBILINOGEN 0.2 08/18/2020 1748   NITRITE NEGATIVE 08/18/2020 1748   LEUKOCYTESUR NEGATIVE 08/18/2020 1748   Sepsis Labs: !!!!!!!!!!!!!!!!!!!!!!!!!!!!!!!!!!!!!!!!!!!! @LABRCNTIP (procalcitonin:4,lacticidven:4) ) Recent Results (from the past 240 hour(s))  Resp Panel by RT-PCR (Flu A&B, Covid) Nasopharyngeal Swab     Status: None   Collection Time: 02/02/21  6:48 PM   Specimen: Nasopharyngeal Swab; Nasopharyngeal(NP) swabs in vial transport medium  Result Value Ref Range Status   SARS Coronavirus 2 by RT PCR NEGATIVE NEGATIVE Final    Comment: (NOTE) SARS-CoV-2 target nucleic acids are NOT DETECTED.  The SARS-CoV-2 RNA is generally detectable in upper respiratory specimens during the acute phase of infection. The lowest concentration of SARS-CoV-2 viral copies this assay can detect is 138 copies/mL. A negative result does not preclude SARS-Cov-2 infection and should not be used as the sole basis for treatment or other patient management decisions. A negative result may occur with  improper specimen collection/handling, submission of specimen other than nasopharyngeal swab, presence of viral mutation(s) within the areas targeted by this  assay, and inadequate number of viral copies(<138 copies/mL). A negative result must be combined with clinical observations, patient history, and epidemiological information. The expected result is Negative.  Fact Sheet for Patients:  02/04/21  Fact Sheet for Healthcare Providers:  BloggerCourse.com  This test is no t yet approved or cleared by the [employer].[place] FDA and  has been authorized for detection and/or diagnosis of SARS-CoV-2 by FDA under an Emergency Use Authorization (EUA). This EUA will remain  in effect (meaning this test can be used) for the duration of the COVID-19 declaration under Section 564(b)(1) of the Act, 21 U.S.C.section 360bbb-3(b)(1), unless the authorization is terminated  or revoked sooner.       Influenza A by PCR NEGATIVE NEGATIVE Final   Influenza B by PCR NEGATIVE NEGATIVE Final    Comment: (NOTE) The Xpert Xpress SARS-CoV-2/FLU/RSV plus assay is intended as an aid in the diagnosis of influenza from Nasopharyngeal swab specimens and should not be used as a sole basis for treatment. Nasal washings and aspirates are unacceptable for Xpert Xpress SARS-CoV-2/FLU/RSV testing.  Fact Sheet for Patients: [place]  Fact Sheet for Healthcare Providers: BloggerCourse.com  This test is not yet approved or cleared by the [employer]ious[employer]ker.[employer] FDA and has been authorized for detection and/or diagnosis of SARS-CoV-2 by FDA under an Emergency Use Authorization (EUA). This EUA will remain in effect (meaning this test can be used) for the duration of the COVID-19 declaration under Section 564(b)(1) of the Act, 21 U.S.C. section 360bbb-3(b)(1), unless the authorization is terminated or revoked.  Performed at [facility], [address], [place], [place]      Radiological Exams on Admission: DG Lumbar Spine 2-3  Views  Result Date: 02/05/2021 CLINICAL DATA:  Fall with lumbar spine pain and tenderness. EXAM: LUMBAR SPINE - 2-3 VIEW COMPARISON:  None. FINDINGS: There is no evidence of lumbar spine fracture. Alignment is normal. Intervertebral disc spaces are maintained. IMPRESSION: Negative. Electronically Signed   By: 02/07/2021 M.D.   On: 02/05/2021 08:26   CT Head Wo Contrast  Result Date: 02/05/2021 CLINICAL DATA:  Head trauma, lethargy, became dizzy and had syncopal episode/fall in the bathroom after waking up this morning. EXAM: CT HEAD WITHOUT CONTRAST TECHNIQUE: Contiguous axial images were obtained from the base of the skull through the vertex without intravenous contrast. Sagittal and coronal MPR images reconstructed from axial data set. COMPARISON:  None FINDINGS: Brain: Normal ventricular morphology. No midline shift or mass effect. Normal appearance of brain parenchyma. No intracranial hemorrhage, mass lesion, evidence of acute infarction, or extra-axial fluid collection. Vascular: No hyperdense vessels Skull: Intact Sinuses/Orbits: Clear Other: N/A IMPRESSION: Normal exam. Electronically Signed   By: Ulyses Southward M.D.   On: 02/05/2021 08:53   Old chart reviewed Case discussed with EDP   Assessment/Plan  35 year old male just discharged yesterday with acute upper GI bleed comes in with a syncopal episode due to further acute blood loss  Principal Problem:    Acute blood loss anemia-hemoglobin has dropped from 10-6.9 since yesterday.  Patient was stable yesterday at discharge with some melanotic stool present.  He did not have any vomiting blood.  EGD only showing gastritis.  Transfuse 2 units of blood.  GI consulted.  Hold all anticoagulants and antiplatelet treatments.  Active Problems:    Acute upper GI bleed-placed on Protonix drip.  GI consulted.  Keep n.p.o. for now.    Syncope-due to GI blood losses.  Transfuse.    Asthma, mild intermittent-continue albuterol inhaler    GIB  (gastrointestinal bleeding)-as above   Further recommendations pending on overall hospital course   DVT prophylaxis: SCDs only Code Status: Full Family Communication: Yes wife at bedside Disposition Plan: Days Consults called: Gastroenterology Admission status: Admission   Marchello Rothgeb A MD [facility]  If 7PM-7AM, please contact night-coverage [email or web address] Password [facility]  02/05/2021, 9:03 AM

## 2021-02-05 NOTE — Anesthesia Procedure Notes (Addendum)
Procedure Name: Intubation Date/Time: 02/05/2021 11:45 AM Performed by: Victoriano Lain, CRNA Pre-anesthesia Checklist: Patient identified, Emergency Drugs available, Suction available, Patient being monitored and Timeout performed Patient Re-evaluated:Patient Re-evaluated prior to induction Oxygen Delivery Method: Circle system utilized Preoxygenation: Pre-oxygenation with 100% oxygen Induction Type: IV induction, Rapid sequence and Cricoid Pressure applied Ventilation: Mask ventilation without difficulty and Oral airway inserted - appropriate to patient size Laryngoscope Size: Miller and 3 Grade View: Grade I Tube type: Oral Tube size: 7.5 mm Number of attempts: 1 Airway Equipment and Method: Stylet Placement Confirmation: ETT inserted through vocal cords under direct vision,  positive ETCO2 and breath sounds checked- equal and bilateral Secured at: 21 cm Tube secured with: Tape Dental Injury: Teeth and Oropharynx as per pre-operative assessment  Comments: Smooth RSI with cricoid pressure by Dr Gloris Manchester. DL X 1 by CRNA with MAC 4. Long epiglottis noted. Unable to see vocal cords. Masked with oral airway. DL X 1 by Dr Chauncey Cruel with Mil 3. Grade 1 view. ATOI.

## 2021-02-05 NOTE — ED Triage Notes (Signed)
Pt arrives via EMS from home. Pt reports he woke up and went to the bathroom where he became dizzy and had a syncopal episode. Pt did strike head, breaking his glasses when he fell.  Wife helped pt up and onto toilet where he had an additional syncopal episode and also reports passing dark soft stool.  18 G LAC, 500 CC NS given en route. Pt currently complaining of dizziness/lightheadedness, and lethargy. A&O x4. EMS placed pt on 4 L O2 due to bluish tint to lips, O2 sats 97%. No blood thinners.   EMS vitals: BP 106/58 HR 116 CBG 377 T 97.1

## 2021-02-05 NOTE — Progress Notes (Signed)
Inpatient Diabetes Program Recommendations  AACE/ADA: New Consensus Statement on Inpatient Glycemic Control (2015)  Target Ranges:  Prepandial:   less than 140 mg/dL      Peak postprandial:   less than 180 mg/dL (1-2 hours)      Critically ill patients:  140 - 180 mg/dL   No results found for: GLUCAP, HGBA1C  Review of Glycemic Control Results for Derek Lawson, Derek Lawson (MRN [id number]) as of 02/05/2021 13:18  Ref. Range 02/02/2021 17:25 02/03/2021 00:40 02/04/2021 04:46 02/05/2021 07:24  Glucose Latest Ref Range: 70 - 99 mg/dL 497 (H) 026 (H) 378 (H) 333 (H)   Diabetes history: None Outpatient Diabetes medications: None Current orders for Inpatient glycemic control:  None Inpatient Diabetes Program Recommendations:   Please consider checking CBG's tid with meals and HS.  If >150 mg/dL, consider adding Novolog sensitive tid with meals and HS.  Thanks, Beryl Meager, RN, BC-ADM Inpatient Diabetes Coordinator Pager [phone] (8a-5p)

## 2021-02-05 NOTE — Transfer of Care (Signed)
Immediate Anesthesia Transfer of Care Note  Patient: Derek Lawson  Procedure(s) Performed: ESOPHAGOGASTRODUODENOSCOPY (EGD) (N/A ) SCLEROTHERAPY HOT HEMOSTASIS (ARGON PLASMA COAGULATION/BICAP) (N/A )  Patient Location: PACU and Endoscopy Unit  Anesthesia Type:General  Level of Consciousness: awake, alert , oriented and patient cooperative  Airway & Oxygen Therapy: Patient Spontanous Breathing and Patient connected to face mask oxygen  Post-op Assessment: Report given to RN, Post -op Vital signs reviewed and stable and Patient moving all extremities  Post vital signs: Reviewed and stable  Last Vitals:  Vitals Value Taken Time  BP 139/77 02/05/21 1223  Temp    Pulse    Resp 18 02/05/21 1225  SpO2    Vitals shown include unvalidated device data.  Last Pain:  Vitals:   02/05/21 1136  TempSrc:   PainSc: 0-No pain         Complications: No complications documented.

## 2021-02-05 NOTE — ED Provider Notes (Signed)
I saw and evaluated the patient, reviewed the resident's note and I agree with the findings and plan.      ED ECG REPORT   Date: 02/05/2021  Rate: 102  Rhythm: sinus tachycardia  QRS Axis: normal  Intervals: normal  ST/T Wave abnormalities: normal  Conduction Disutrbances:none  Narrative Interpretation:   Old EKG Reviewed: none available  I have personally reviewed the EKG tracing and agree with the computerized printout as noted.  35 year old male who presents with weakness and syncope after standing up out of bed.  Recent mission for GI bleed.  Here patient has had hematemesis here.  Will screen for trauma and readmit to the medicine service with GI consultation   Lorre Nick, MD 02/05/21 [phone]

## 2021-02-05 NOTE — Op Note (Signed)
[facility] Patient Name: Derek Readernshaun Musto Procedure Date: 02/05/2021 MRN: [id number] Attending MD: Vida RiggerMarc Vivaan Helseth , MD Date of Birth: 12/24/1985 CSN: [id number] Age: 35 Admit Type: Emergency Department Procedure:                Upper GI endoscopy Indications:              Hematemesis, Melena Providers:                Vida RiggerMarc Makai Dumond, MD, Weston SettleBethany Thacker, RN, Janie Billups,                            Technician, Lauree ChandlerLacey A. Armistead, CRNA Referring MD:              Medicines:                General Anesthesia Complications:            No immediate complications. Estimated Blood Loss:     Estimated blood loss: none. Procedure:                Pre-Anesthesia Assessment:                           - Prior to the procedure, a History and Physical                            was performed, and patient medications and                            allergies were reviewed. The patient's tolerance of                            previous anesthesia was also reviewed. The risks                            and benefits of the procedure and the sedation                            options and risks were discussed with the patient.                            All questions were answered, and informed consent                            was obtained. Prior Anticoagulants: The patient has                            taken no previous anticoagulant or antiplatelet                            agents. ASA Grade Assessment: II - A patient with                            mild systemic disease. After reviewing the risks  and benefits, the patient was deemed in                            satisfactory condition to undergo the procedure.                           After obtaining informed consent, the endoscope was                            passed under direct vision. Throughout the                            procedure, the patient's blood pressure, pulse, and                             oxygen saturations were monitored continuously. The                            GIF-H190 ([id number] Olympus gastroscope was                            introduced through the mouth, and advanced to the                            third part of duodenum. The upper GI endoscopy was                            accomplished without difficulty. The patient                            tolerated the procedure well. Scope In: Scope Out: Findings:      The examined esophagus was normal.      Hematin (altered blood/coffee-ground-like material) was found on the       greater curvature of the stomach. Most washed and suctioned from the       stomach      One non-bleeding cratered gastric ulcer with a visible vessel was found       in the gastric antrum in the prepyloric area and unfortunately it was       not seen on the other days procedure possibly due to spasm possibly due       to increased bubbles and old blood not adequately washed away. Area was       successfully injected with 3 mL of a 1:10,000 solution of epinephrine       for hemostasis over 3 injections of 1 cc each. Coagulation for bleeding       prevention using heater probe was successful to obliterate the visible       vessel.      The ampulla, duodenal bulb, first portion of the duodenum, second       portion of the duodenum, major papilla, area of the papilla and third       portion of the duodenum were normal.      The exam was otherwise without abnormality. Impression:               - Normal esophagus.                           -  Hematin (altered blood/coffee-ground-like                            material) in the greater curvature of the stomach.                            Most washed and suctioned from the stomach                           - Non-bleeding gastric ulcer with a visible vessel.                            Injected. Treated with a heater probe.                           - Normal ampulla, duodenal bulb, first portion of                             the duodenum, second portion of the duodenum, major                            papilla, area of the papilla and third portion of                            the duodenum.                           - The examination was otherwise normal.                           - No specimens collected. Moderate Sedation:      Not Applicable - Patient had care per Anesthesia. Recommendation:           - Clear liquid diet for 2 days.                           - Continue present medications. Would recommend                            checking H. pylori antibodies and can treat as                            outpatient if positive although I think his cause                            is nonsteroidals                           - Return to GI clinic in 1 week.                           - Telephone GI clinic if symptomatic PRN. Procedure Code(s):        --- Professional ---  43255, Esophagogastroduodenoscopy, flexible,                            transoral; with control of bleeding, any method Diagnosis Code(s):        --- Professional ---                           K92.2, Gastrointestinal hemorrhage, unspecified                           K25.4, Chronic or unspecified gastric ulcer with                            hemorrhage                           K92.0, Hematemesis                           K92.1, Melena (includes Hematochezia) CPT copyright 2019 American Medical Association. All rights reserved. The codes documented in this report are preliminary and upon coder review may  be revised to meet current compliance requirements. Vida Rigger, MD 02/05/2021 12:28:29 PM This report has been signed electronically. Number of Addenda: 0

## 2021-02-05 NOTE — ED Provider Notes (Signed)
[facility]-EMERGENCY DEPT Provider Note   CSN: [id number] Arrival date & time: 02/05/21  7253    History Chief Complaint  Patient presents with  . Dizziness  . Fatigue    Derek Lawson is a 35 y.o. male with recent hospitalization for anemia 2/2 suspected UGIB presenting after fall this morning. Patient states he got up this morning and felt dizzy. Says he remembers passing out, denies hitting head. Denies fevers, chest pain, dyspnea, nausea, vomiting, abdominal pain, diarrhea. He does mention continuing to have dark stools. He has not been able to start PPI. Says currently his back hurts and is tired. Per EMS, patient did hit his head on toilet when he passed out.     Past Medical History:  Diagnosis Date  . Asthma   . Hypertension    Patient Active Problem List   Diagnosis Date Noted  . Dizziness 02/03/2021  . Acute blood loss anemia 02/03/2021  . Left ankle pain 02/03/2021  . Allergic rhinitis   . Acute upper GI bleed 02/02/2021  . Essential hypertension 04/15/2016  . Asthma, mild intermittent 04/15/2016  . Obesity 04/15/2016  . BMI 33.0-33.9,adult 04/15/2016  . Tobacco dependence 04/15/2016   History reviewed. No pertinent surgical history.    Family History  Problem Relation Age of Onset  . Diabetes Father    Social History   Tobacco Use  . Smoking status: Former Smoker    Packs/day: 0.50    Years: 0.50    Pack years: 0.25    Types: Cigarettes    Quit date: 08/10/2013    Years since quitting: 7.4  . Smokeless tobacco: Never Used  Substance Use Topics  . Alcohol use: Not Currently    Comment: socially  . Drug use: No   Home Medications Prior to Admission medications   Medication Sig Start Date End Date Taking? Authorizing Provider  albuterol (VENTOLIN HFA) 108 (90 Base) MCG/ACT inhaler Inhale 1-2 puffs into the lungs every 6 (six) hours as needed for wheezing or shortness of breath. 10/01/20   Mardella Layman, MD  cetirizine  (ZYRTEC) 10 MG chewable tablet Chew 10 mg by mouth daily.    [provider]  lisinopril (ZESTRIL) 10 MG tablet Take 1 tablet (10 mg total) by mouth daily. 02/04/21   Jae Dire, MD  Multiple Vitamin (MULTIVITAMIN WITH MINERALS) TABS tablet Take 1 tablet by mouth daily.    [provider]  pantoprazole (PROTONIX) 40 MG tablet Take 1 tablet (40 mg total) by mouth daily. 02/04/21 03/06/21  Jae Dire, MD  ALBUTEROL IN Inhale 2 puffs into the lungs 4 (four) times daily as needed.   08/10/14  [provider]  atorvastatin (LIPITOR) 20 MG tablet Take 1 tablet (20 mg total) by mouth daily. 04/16/16 02/22/20  Massie Maroon, FNP  montelukast (SINGULAIR) 10 MG tablet Take 1 tablet (10 mg total) by mouth at bedtime. 04/15/16 02/22/20  Massie Maroon, FNP    Allergies    Charentais melon (french melon), Shellfish allergy, and Sulfa antibiotics  Review of Systems   Review of Systems  Constitutional: Positive for fatigue. Negative for fever.  Respiratory: Negative for shortness of breath.   Cardiovascular: Negative for chest pain and leg swelling.  Gastrointestinal: Negative for abdominal pain, nausea and vomiting.    Physical Exam Updated Vital Signs BP 135/70   Pulse (!) 116   Temp 97.8 F (36.6 C) (Oral)   Resp 20   Ht 5\' 10"  (1.778 m)  Wt 104.3 kg   SpO2 100%   BMI 33.00 kg/m   Physical Exam Constitutional:      General: He is not in acute distress.    Appearance: He is overweight. He is not toxic-appearing or diaphoretic.  HENT:     Head: Normocephalic. Abrasion present.   Eyes:     General: Lids are normal. Vision grossly intact.     Comments: Conjunctival pallor present bilaterally.  Cardiovascular:     Rate and Rhythm: Regular rhythm. Tachycardia present.     Pulses: Normal pulses.     Heart sounds: Normal heart sounds.  Pulmonary:     Effort: Pulmonary effort is normal.     Comments: Mild bilateral wheezing present Abdominal:      General: Bowel sounds are normal. There is no distension.     Palpations: Abdomen is soft.     Tenderness: There is no abdominal tenderness.  Musculoskeletal:     Right lower leg: No edema.     Left lower leg: No edema.     Comments: Lumbar bony tenderness present  Skin:    General: Skin is warm and dry.  Neurological:     Mental Status: He is oriented to person, place, and time. He is lethargic.     Cranial Nerves: No cranial nerve deficit, dysarthria or facial asymmetry.     Sensory: Sensation is intact.     Motor: Motor function is intact.    ED Results / Procedures / Treatments   Labs (all labs ordered are listed, but only abnormal results are displayed) Labs Reviewed  CBC WITH DIFFERENTIAL/PLATELET - Abnormal; Notable for the following components:      Result Value   WBC 13.2 (*)    RBC 2.37 (*)    Hemoglobin 6.9 (*)    HCT 21.0 (*)    Neutro Abs 9.3 (*)    Abs Immature Granulocytes 0.20 (*)    All other components within normal limits  BASIC METABOLIC PANEL - Abnormal; Notable for the following components:   Glucose, Bld 333 (*)    BUN 32 (*)    Calcium 8.3 (*)    All other components within normal limits  RESP PANEL BY RT-PCR (FLU A&B, COVID) ARPGX2  TYPE AND SCREEN  PREPARE RBC (CROSSMATCH)   EKG None  Radiology DG Lumbar Spine 2-3 Views  Result Date: 02/05/2021 CLINICAL DATA:  Fall with lumbar spine pain and tenderness. EXAM: LUMBAR SPINE - 2-3 VIEW COMPARISON:  None. FINDINGS: There is no evidence of lumbar spine fracture. Alignment is normal. Intervertebral disc spaces are maintained. IMPRESSION: Negative. Electronically Signed   By: Kennith Center M.D.   On: 02/05/2021 08:26   CT Head Wo Contrast  Result Date: 02/05/2021 CLINICAL DATA:  Head trauma, lethargy, became dizzy and had syncopal episode/fall in the bathroom after waking up this morning. EXAM: CT HEAD WITHOUT CONTRAST TECHNIQUE: Contiguous axial images were obtained from the base of the skull  through the vertex without intravenous contrast. Sagittal and coronal MPR images reconstructed from axial data set. COMPARISON:  None FINDINGS: Brain: Normal ventricular morphology. No midline shift or mass effect. Normal appearance of brain parenchyma. No intracranial hemorrhage, mass lesion, evidence of acute infarction, or extra-axial fluid collection. Vascular: No hyperdense vessels Skull: Intact Sinuses/Orbits: Clear Other: N/A IMPRESSION: Normal exam. Electronically Signed   By: Ulyses Southward M.D.   On: 02/05/2021 08:53   Procedures None  Medications Ordered in ED Medications  pantoprazole (PROTONIX) 80 mg in sodium chloride  0.9 % 100 mL (0.8 mg/mL) infusion (has no administration in time range)  pantoprazole (PROTONIX) 80 mg in sodium chloride 0.9 % 100 mL IVPB (has no administration in time range)  0.9 %  sodium chloride infusion (has no administration in time range)  metoCLOPramide (REGLAN) injection 10 mg (10 mg Intravenous Given 02/05/21 0805)  lactated ringers bolus 1,000 mL (1,000 mLs Intravenous New Bag/Given 02/05/21 0805)   ED Course  I have reviewed the triage vital signs and the nursing notes.  Pertinent labs & imaging results that were available during my care of the patient were reviewed by me and considered in my medical decision making (see chart for details).    MDM Rules/Calculators/A&P                         On arrival patient tachycardic, afebrile, sating well on 4L supplemental oxygen due to cyanosis noted via EMS. CBC, BMP, type and screen, orthostatics pending. Will obtain CT head, lumbar XR for further evaluation after fall this morning. Plan for 1L LR now as well. Suspect presentation is further due to GI bleed, cannot rule out PE given recent hospitalization as well.  While in CT, patient with hematemesis, giving Reglan. Discussed with GI, who will see. Plan for admission to medicine. Discussed with hospitalist, who agrees for admission.   Final Clinical  Impression(s) / ED Diagnoses Final diagnoses:  Upper GI bleed   Rx / DC Orders ED Discharge Orders    None       Evlyn Kanner, MD 02/05/21 [id number]    Lorre Nick, MD 02/10/21 1925

## 2021-02-05 NOTE — Anesthesia Postprocedure Evaluation (Signed)
Anesthesia Post Note  Patient: Derek Lawson  Procedure(s) Performed: ESOPHAGOGASTRODUODENOSCOPY (EGD) (N/A ) SCLEROTHERAPY HOT HEMOSTASIS (ARGON PLASMA COAGULATION/BICAP) (N/A )     Patient location during evaluation: Endoscopy Anesthesia Type: General Level of consciousness: awake and alert Pain management: pain level controlled Vital Signs Assessment: post-procedure vital signs reviewed and stable Respiratory status: spontaneous breathing, nonlabored ventilation, respiratory function stable and patient connected to nasal cannula oxygen Cardiovascular status: blood pressure returned to baseline and stable Postop Assessment: no apparent nausea or vomiting Anesthetic complications: no   No complications documented.  Last Vitals:  Vitals:   02/05/21 1240 02/05/21 1250  BP: (!) 145/70 (!) 144/103  Pulse: (!) 110 (!) 115  Resp: 17 (!) 22  Temp:  37.1 C  SpO2: 95% 94%    Last Pain:  Vitals:   02/05/21 1250  TempSrc: Oral  PainSc: 0-No pain                 Earl Lites P Niurka Benecke

## 2021-02-05 NOTE — ED Notes (Signed)
Pt transported to CT again.

## 2021-02-05 NOTE — ED Notes (Signed)
This RN notified by CT that pt was vomiting blood in CT room. This RN spoke with Dr. Freida Busman and got order for nausea medication. Pt transported back to room and will try again for CT once less nauseous.

## 2021-02-05 NOTE — Plan of Care (Signed)
  Problem: Education: Goal: Ability to identify signs and symptoms of gastrointestinal bleeding will improve Outcome: Progressing   Problem: Bowel/Gastric: Goal: Will show no signs and symptoms of gastrointestinal bleeding Outcome: Progressing   Problem: Fluid Volume: Goal: Will show no signs and symptoms of excessive bleeding Outcome: Progressing   Problem: Clinical Measurements: Goal: Complications related to the disease process, condition or treatment will be avoided or minimized Outcome: Progressing   Problem: Education: Goal: Knowledge of General Education information will improve Description: Including pain rating scale, medication(s)/side effects and non-pharmacologic comfort measures Outcome: Progressing   Problem: Clinical Measurements: Goal: Ability to maintain clinical measurements within normal limits will improve Outcome: Progressing Goal: Diagnostic test results will improve Outcome: Progressing Goal: Respiratory complications will improve Outcome: Progressing   Problem: Pain Managment: Goal: General experience of comfort will improve Outcome: Progressing   Problem: Safety: Goal: Ability to remain free from injury will improve Outcome: Progressing

## 2021-02-05 NOTE — ED Notes (Signed)
Pt transported to CT ?

## 2021-02-05 NOTE — Anesthesia Preprocedure Evaluation (Signed)
Anesthesia Evaluation  Patient identified by MRN, date of birth, ID band Patient awake    Reviewed: Allergy & Precautions, NPO status , Patient's Chart, lab work & pertinent test results  Airway Mallampati: III  TM Distance: >3 FB Neck ROM: Full    Dental  (+) Teeth Intact   Pulmonary asthma , former smoker,    Pulmonary exam normal        Cardiovascular hypertension, Pt. on medications  Rhythm:Regular Rate:Tachycardia     Neuro/Psych negative neurological ROS  negative psych ROS   GI/Hepatic Neg liver ROS, GI bleed   Endo/Other  negative endocrine ROS  Renal/GU negative Renal ROS  negative genitourinary   Musculoskeletal negative musculoskeletal ROS (+)   Abdominal (+) + obese,   Peds  Hematology  (+) anemia ,   Anesthesia Other Findings   Reproductive/Obstetrics                             Anesthesia Physical Anesthesia Plan  ASA: III  Anesthesia Plan: General   Post-op Pain Management:    Induction: Intravenous and Rapid sequence  PONV Risk Score and Plan: 2 and Ondansetron, Dexamethasone, Midazolam and Treatment may vary due to age or medical condition  Airway Management Planned: Mask and Oral ETT  Additional Equipment: None  Intra-op Plan:   Post-operative Plan: Extubation in OR  Informed Consent: I have reviewed the patients History and Physical, chart, labs and discussed the procedure including the risks, benefits and alternatives for the proposed anesthesia with the patient or authorized representative who has indicated his/her understanding and acceptance.     Dental advisory given  Plan Discussed with: CRNA  Anesthesia Plan Comments: (- 1U PRBC transfused pre-procedure  Lab Results      Component                Value               Date                      WBC                      13.2 (H)            02/05/2021                HGB                      6.9  (LL)            02/05/2021                HCT                      21.0 (L)            02/05/2021                MCV                      88.6                02/05/2021                PLT                      230  02/05/2021          )        Anesthesia Quick Evaluation

## 2021-02-06 DIAGNOSIS — K922 Gastrointestinal hemorrhage, unspecified: Secondary | ICD-10-CM

## 2021-02-06 DIAGNOSIS — D62 Acute posthemorrhagic anemia: Secondary | ICD-10-CM

## 2021-02-06 DIAGNOSIS — R55 Syncope and collapse: Secondary | ICD-10-CM

## 2021-02-06 LAB — BASIC METABOLIC PANEL
Anion gap: 7 (ref 5–15)
BUN: 18 mg/dL (ref 6–20)
CO2: 24 mmol/L (ref 22–32)
Calcium: 8.7 mg/dL — ABNORMAL LOW (ref 8.9–10.3)
Chloride: 110 mmol/L (ref 98–111)
Creatinine, Ser: 1.03 mg/dL (ref 0.61–1.24)
GFR, Estimated: >60 mL/min (ref >60)
Glucose, Bld: 150 mg/dL — ABNORMAL HIGH (ref 70–99)
Potassium: 3.8 mmol/L (ref 3.5–5.1)
Sodium: 141 mmol/L (ref 135–145)

## 2021-02-06 LAB — BPAM RBC
Blood Product Expiration Date: 202205162359
Blood Product Expiration Date: 202205162359
ISSUE DATE / TIME: 202204210937
ISSUE DATE / TIME: 202204211448
Unit Type and Rh: 5100
Unit Type and Rh: 5100

## 2021-02-06 LAB — TYPE AND SCREEN
ABO/RH(D): O POS
Antibody Screen: NEGATIVE
Unit division: 0
Unit division: 0

## 2021-02-06 LAB — HEMOGLOBIN AND HEMATOCRIT, BLOOD
HCT: 27.3 % — ABNORMAL LOW (ref 39.0–52.0)
HCT: 27.4 % — ABNORMAL LOW (ref 39.0–52.0)
Hemoglobin: 9.2 g/dL — ABNORMAL LOW (ref 13.0–17.0)
Hemoglobin: 9 g/dL — ABNORMAL LOW (ref 13.0–17.0)

## 2021-02-06 LAB — CBC
HCT: 26.2 % — ABNORMAL LOW (ref 39.0–52.0)
Hemoglobin: 9 g/dL — ABNORMAL LOW (ref 13.0–17.0)
MCH: 30.8 pg (ref 26.0–34.0)
MCHC: 34.4 g/dL (ref 30.0–36.0)
MCV: 89.7 fL (ref 80.0–100.0)
Platelets: 205 10*3/uL (ref 150–400)
RBC: 2.92 MIL/uL — ABNORMAL LOW (ref 4.22–5.81)
RDW: 14.5 % (ref 11.5–15.5)
WBC: 13 10*3/uL — ABNORMAL HIGH (ref 4.0–10.5)
nRBC: 0.2 % (ref 0.0–0.2)

## 2021-02-06 LAB — GLUCOSE, CAPILLARY
Glucose-Capillary: 156 mg/dL — ABNORMAL HIGH (ref 70–99)
Glucose-Capillary: 130 mg/dL — ABNORMAL HIGH (ref 70–99)

## 2021-02-06 LAB — HEMOGLOBIN A1C
Hgb A1c MFr Bld: 7.7 % — ABNORMAL HIGH (ref 4.8–5.6)
Mean Plasma Glucose: 174.29 mg/dL

## 2021-02-06 MED ORDER — INSULIN ASPART 100 UNIT/ML ~~LOC~~ SOLN: 0.0000 [IU] | Freq: Every day | SUBCUTANEOUS | Status: DC

## 2021-02-06 MED ORDER — INSULIN ASPART 100 UNIT/ML ~~LOC~~ SOLN
0.0000 [IU] | Freq: Three times a day (TID) | SUBCUTANEOUS | Status: DC
  Administered 2021-02-06 – 2021-02-08 (×5): 2 [IU] via SUBCUTANEOUS

## 2021-02-06 NOTE — Progress Notes (Signed)
PROGRESS NOTE    Derek Lawson    Code Status: Full Code  BZJ:[id number] DOB: 12-Dec-1985 DOA: 02/05/2021 LOS: 1 days  PCP: Patient, No Pcp Per (Inactive) CC:  Chief Complaint  Patient presents with  . Dizziness  . Fatigue       Hospital Summary   This is a 35 year old male with a past medical history of hypertension, asthma, reported gout who presented to the ED on 4/21 after a syncopal episode at home. He was recently discharged on 4/20 after treatment for a GI bleed thought to be from NSAID use and was discharged home after tolerating PO intake. 4/19: EGD with Dr. Ewing Schlein probable caustic gastritis, tiny hiatal hernia, normal duodenum and otherwise exam unremarkable.    ED course: Afebrile, tachycardic and hemodynamically stable.  WBC 13.2 and Hb 6.9, glucose 333 otherwise labs overall unremarkable.  He was transfused 2 units PRBCs and GI was consulted.  He was started on a Protonix drip and underwent urgent endoscopy  EGD 4/21: Normal esophagus, hematin in the greater curvature the stomach, nonbleeding gastric ulcer with a visible vessel which was injected and treated with a heater probe otherwise unremarkable.  A & P   Principal Problem:   Acute blood loss anemia Active Problems:   Asthma, mild intermittent   Acute upper GI bleed   Syncope   GIB (gastrointestinal bleeding)   1. Acute blood loss anemia secondary to recurrent GI bleed, suspected NSAID induced a. Hemodynamically stable on room air with stable Hb following 2 units PRBCs b. EGD 4/21: Normal esophagus, hematin in the greater curvature the stomach, nonbleeding gastric ulcer with a visible vessel which was injected and treated with a heater probe otherwise unremarkable. c. Follow-up H. pylori d. Continue Protonix drip e. Okay to advance to full liquid diet per GI f. Trend H&H and transfuse for Hb <7.0  2. Syncope secondary to GI blood losses a. Orthostatic vital signs  3. Hyperglycemia concerning for newly  diagnosed diabetes a. Glucose 333 on presentation b. HbA1c c. NovoLog sensitive scale 3 times daily with meals and at bedtime  4. Asthma, stable a. Continue albuterol inhaler  5. Leukocytosis, likely reactive   DVT prophylaxis: SCDs Start: 02/05/21 1310   Family Communication: Patient's wife has been updated   Disposition Plan:  Status is: Inpatient  Remains inpatient appropriate because:IV treatments appropriate due to intensity of illness or inability to take PO and Inpatient level of care appropriate due to severity of illness   Dispo: The patient is from: Home              Anticipated d/c is to: Home              Patient currently is not medically stable to d/c.   Difficult to place patient No           Pressure injury documentation    None  Consultants  GI   Procedures  4/21 EGD   Antibiotics   Anti-infectives (From admission, onward)   None        Subjective   Seen and examined at bedside no acute distress resting comfortably.  Patient states he is feeling a bit better today than when he came in.  Says that he was feeling well when he went home at his recent discharge and was able to eat dinner.  When he was waking up yesterday morning he felt ill and when he stood up he passed out.  Wife says that he went  to the bathroom to have a BM, which was loose, he had a another brief syncopal episode prompting her to contact EMS.  He denies any abdominal pain, nausea or vomiting or any other complaints at this time.  No overnight events.  Objective   Vitals:   02/06/21 0900 02/06/21 1000 02/06/21 1100 02/06/21 1200  BP:      Pulse: 66 62 71   Resp:      Temp:    97.6 F (36.4 C)  TempSrc:    Oral  SpO2: 100% 99% 100%   Weight:      Height:        Intake/Output Summary (Last 24 hours) at 02/06/2021 1330 Last data filed at 02/06/2021 0600 Gross per 24 hour  Intake 906.72 ml  Output 1650 ml  Net -743.28 ml   Filed Weights   02/05/21 0649   Weight: 104.3 kg    Examination:  Physical Exam Vitals and nursing note reviewed.  Constitutional:      Appearance: Normal appearance.  HENT:     Head: Normocephalic and atraumatic.  Eyes:     Conjunctiva/sclera: Conjunctivae normal.  Cardiovascular:     Rate and Rhythm: Normal rate and regular rhythm.  Pulmonary:     Effort: Pulmonary effort is normal.     Breath sounds: Normal breath sounds.  Abdominal:     General: Abdomen is flat.     Palpations: Abdomen is soft.  Musculoskeletal:        General: No swelling or tenderness.  Skin:    Coloration: Skin is not jaundiced or pale.  Neurological:     Mental Status: He is alert. Mental status is at baseline.  Psychiatric:        Mood and Affect: Mood normal.        Behavior: Behavior normal.     Data Reviewed: I have personally reviewed following labs and imaging studies  CBC: Recent Labs  Lab 02/02/21 1725 02/02/21 2107 02/03/21 0040 02/03/21 0909 02/04/21 0549 02/05/21 0724 02/06/21 0242 02/06/21 0918  WBC 8.9  --  8.7  --  6.7 13.2* 13.0*  --   NEUTROABS 6.3  --   --   --  3.3 9.3*  --   --   HGB 12.4*   < > 11.8* 10.9* 9.6* 6.9* 9.0* 9.2*  HCT 36.6*   < > 35.1* 32.6* 28.3* 21.0* 26.2* 27.3*  MCV 85.3  --  85.6  --  85.5 88.6 89.7  --   PLT 233  --  248  --  198 230 205  --    < > = values in this interval not displayed.   Basic Metabolic Panel: Recent Labs  Lab 02/02/21 1725 02/03/21 0040 02/04/21 0446 02/05/21 0724 02/06/21 0242  NA 137 141 140 140 141  K 4.8 3.8 3.2* 4.7 3.8  CL 101 106 110 109 110  CO2 27 23 24 25 24   GLUCOSE 295* 185* 153* 333* 150*  BUN 24* 20 20 32* 18  CREATININE 0.95 0.95 0.83 1.12 1.03  CALCIUM 9.5 9.4 7.5* 8.3* 8.7*  MG  --  2.0  --   --   --    GFR: Estimated Creatinine Clearance: 119.9 mL/min (by C-G formula based on SCr of 1.03 mg/dL). Liver Function Tests: Recent Labs  Lab 02/02/21 1725 02/03/21 0040  AST 25 22  ALT 71* 63*  ALKPHOS 63 60  BILITOT 0.7  0.5  PROT 7.4 6.9  ALBUMIN 4.2 3.9  No results for input(s): LIPASE, AMYLASE in the last 168 hours. No results for input(s): AMMONIA in the last 168 hours. Coagulation Profile: Recent Labs  Lab 02/02/21 1725 02/03/21 0040  INR 1.0 1.0   Cardiac Enzymes: No results for input(s): CKTOTAL, CKMB, CKMBINDEX, TROPONINI in the last 168 hours. BNP (last 3 results) No results for input(s): PROBNP in the last 8760 hours. HbA1C: No results for input(s): HGBA1C in the last 72 hours. CBG: No results for input(s): GLUCAP in the last 168 hours. Lipid Profile: No results for input(s): CHOL, HDL, LDLCALC, TRIG, CHOLHDL, LDLDIRECT in the last 72 hours. Thyroid Function Tests: No results for input(s): TSH, T4TOTAL, FREET4, T3FREE, THYROIDAB in the last 72 hours. Anemia Panel: No results for input(s): VITAMINB12, FOLATE, FERRITIN, TIBC, IRON, RETICCTPCT in the last 72 hours. Sepsis Labs: No results for input(s): PROCALCITON, LATICACIDVEN in the last 168 hours.  Recent Results (from the past 240 hour(s))  Resp Panel by RT-PCR (Flu A&B, Covid) Nasopharyngeal Swab     Status: None   Collection Time: 02/02/21  6:48 PM   Specimen: Nasopharyngeal Swab; Nasopharyngeal(NP) swabs in vial transport medium  Result Value Ref Range Status   SARS Coronavirus 2 by RT PCR NEGATIVE NEGATIVE Final    Comment: (NOTE) SARS-CoV-2 target nucleic acids are NOT DETECTED.  The SARS-CoV-2 RNA is generally detectable in upper respiratory specimens during the acute phase of infection. The lowest concentration of SARS-CoV-2 viral copies this assay can detect is 138 copies/mL. A negative result does not preclude SARS-Cov-2 infection and should not be used as the sole basis for treatment or other patient management decisions. A negative result may occur with  improper specimen collection/handling, submission of specimen other than nasopharyngeal swab, presence of viral mutation(s) within the areas targeted by this  assay, and inadequate number of viral copies(<138 copies/mL). A negative result must be combined with clinical observations, patient history, and epidemiological information. The expected result is Negative.  Fact Sheet for Patients:  BloggerCourse.com  Fact Sheet for Healthcare Providers:  SeriousBroker.it  This test is no t yet approved or cleared by the [place] FDA and  has been authorized for detection and/or diagnosis of SARS-CoV-2 by FDA under an Emergency Use Authorization (EUA). This EUA will remain  in effect (meaning this test can be used) for the duration of the COVID-19 declaration under Section 564(b)(1) of the Act, 21 U.S.C.section 360bbb-3(b)(1), unless the authorization is terminated  or revoked sooner.       Influenza A by PCR NEGATIVE NEGATIVE Final   Influenza B by PCR NEGATIVE NEGATIVE Final    Comment: (NOTE) The Xpert Xpress SARS-CoV-2/FLU/RSV plus assay is intended as an aid in the diagnosis of influenza from Nasopharyngeal swab specimens and should not be used as a sole basis for treatment. Nasal washings and aspirates are unacceptable for Xpert Xpress SARS-CoV-2/FLU/RSV testing.  Fact Sheet for Patients: BloggerCourse.com  Fact Sheet for Healthcare Providers: SeriousBroker.it  This test is not yet approved or cleared by the [place] FDA and has been authorized for detection and/or diagnosis of SARS-CoV-2 by FDA under an Emergency Use Authorization (EUA). This EUA will remain in effect (meaning this test can be used) for the duration of the COVID-19 declaration under Section 564(b)(1) of the Act, 21 U.S.C. section 360bbb-3(b)(1), unless the authorization is terminated or revoked.  Performed at [facility], [address], [place], [place] [address]   Resp Panel by RT-PCR (Flu A&B, Covid) Nasopharyngeal Swab  Status:  None   Collection Time: 02/05/21  9:14 AM   Specimen: Nasopharyngeal Swab; Nasopharyngeal(NP) swabs in vial transport medium  Result Value Ref Range Status   SARS Coronavirus 2 by RT PCR NEGATIVE NEGATIVE Final    Comment: (NOTE) SARS-CoV-2 target nucleic acids are NOT DETECTED.  The SARS-CoV-2 RNA is generally detectable in upper respiratory specimens during the acute phase of infection. The lowest concentration of SARS-CoV-2 viral copies this assay can detect is 138 copies/mL. A negative result does not preclude SARS-Cov-2 infection and should not be used as the sole basis for treatment or other patient management decisions. A negative result may occur with  improper specimen collection/handling, submission of specimen other than nasopharyngeal swab, presence of viral mutation(s) within the areas targeted by this assay, and inadequate number of viral copies(<138 copies/mL). A negative result must be combined with clinical observations, patient history, and epidemiological information. The expected result is Negative.  Fact Sheet for Patients:  BloggerCourse.comhttps://www.fda.gov/media/152166/download  Fact Sheet for Healthcare Providers:  SeriousBroker.ithttps://www.fda.gov/media/152162/download  This test is no t yet approved or cleared by the [place] FDA and  has been authorized for detection and/or diagnosis of SARS-CoV-2 by FDA under an Emergency Use Authorization (EUA). This EUA will remain  in effect (meaning this test can be used) for the duration of the COVID-19 declaration under Section 564(b)(1) of the Act, 21 U.S.C.section 360bbb-3(b)(1), unless the authorization is terminated  or revoked sooner.       Influenza A by PCR NEGATIVE NEGATIVE Final   Influenza B by PCR NEGATIVE NEGATIVE Final    Comment: (NOTE) The Xpert Xpress SARS-CoV-2/FLU/RSV plus assay is intended as an aid in the diagnosis of influenza from Nasopharyngeal swab specimens and should not be used as a sole basis for  treatment. Nasal washings and aspirates are unacceptable for Xpert Xpress SARS-CoV-2/FLU/RSV testing.  Fact Sheet for Patients: BloggerCourse.comhttps://www.fda.gov/media/152166/download  Fact Sheet for Healthcare Providers: SeriousBroker.ithttps://www.fda.gov/media/152162/download  This test is not yet approved or cleared by the [place] FDA and has been authorized for detection and/or diagnosis of SARS-CoV-2 by FDA under an Emergency Use Authorization (EUA). This EUA will remain in effect (meaning this test can be used) for the duration of the COVID-19 declaration under Section 564(b)(1) of the Act, 21 U.S.C. section 360bbb-3(b)(1), unless the authorization is terminated or revoked.  Performed at [facility], [address] [place], [place] [address]   MRSA PCR Screening     Status: Abnormal   Collection Time: 02/05/21  1:17 PM   Specimen: Nasopharyngeal  Result Value Ref Range Status   MRSA by PCR POSITIVE (A) NEGATIVE Final    Comment:        The GeneXpert MRSA Assay (FDA approved for NASAL specimens only), is one component of a comprehensive MRSA colonization surveillance program. It is not intended to diagnose MRSA infection nor to guide or monitor treatment for MRSA infections. RESULT CALLED TO, READ BACK BY AND VERIFIED WITH: A.HEAVNER, RN AT 1621 ON 04.21.22 BY N.THOMPSON Performed at [facility], [address] [place], [place] [address]          Radiology Studies: DG Lumbar Spine 2-3 Views  Result Date: 02/05/2021 CLINICAL DATA:  Fall with lumbar spine pain and tenderness. EXAM: LUMBAR SPINE - 2-3 VIEW COMPARISON:  None. FINDINGS: There is no evidence of lumbar spine fracture. Alignment is normal. Intervertebral disc spaces are maintained. IMPRESSION: Negative. Electronically Signed   By: Kennith CenterEric  Mansell M.D.   On: 02/05/2021 08:26  CT Head Wo Contrast  Result Date: 02/05/2021 CLINICAL DATA:  Head trauma, lethargy, became dizzy and had  syncopal episode/fall in the bathroom after waking up this morning. EXAM: CT HEAD WITHOUT CONTRAST TECHNIQUE: Contiguous axial images were obtained from the base of the skull through the vertex without intravenous contrast. Sagittal and coronal MPR images reconstructed from axial data set. COMPARISON:  None FINDINGS: Brain: Normal ventricular morphology. No midline shift or mass effect. Normal appearance of brain parenchyma. No intracranial hemorrhage, mass lesion, evidence of acute infarction, or extra-axial fluid collection. Vascular: No hyperdense vessels Skull: Intact Sinuses/Orbits: Clear Other: N/A IMPRESSION: Normal exam. Electronically Signed   By: Ulyses Southward M.D.   On: 02/05/2021 08:53        Scheduled Meds: . Chlorhexidine Gluconate Cloth  6 each Topical Daily  . mupirocin ointment   Nasal BID  . sodium chloride flush  3 mL Intravenous Q12H   Continuous Infusions: . sodium chloride    . lactated ringers Stopped (02/05/21 1330)  . pantoprozole (PROTONIX) infusion 8 mg/hr (02/06/21 0600)     Time spent: 30 minutes with over 50% of the time coordinating the patient's care    Jae Dire, DO [facility]   Call night coverage person covering after 7pm

## 2021-02-06 NOTE — Progress Notes (Signed)
Chaplain engaged in an initial visit with Derek Lawson.  Chaplain offered support and checked-in.    Chaplain is available to follow-up.    02/06/21 1200  Clinical Encounter Type  Visited With Patient and family together  Visit Type Initial

## 2021-02-06 NOTE — Progress Notes (Addendum)
[facility] Progress Note  Derek Lawson 35 y.o. [birth year]  CC:  Upper GI Bleeding   Subjective: Patient denies abdominal pain, nausea, or vomiting.  He states last BM was last night (liquid, black).  He  ROS : Review of Systems  Cardiovascular: Negative for chest pain and palpitations.  Gastrointestinal: Positive for diarrhea and melena. Negative for abdominal pain, blood in stool, constipation, heartburn, nausea and vomiting.    Objective: Vital signs in last 24 hours: Vitals:   02/06/21 0600 02/06/21 0800  BP: 120/75   Pulse: 74   Resp:    Temp:  98 F (36.7 C)  SpO2: 99%     Physical Exam:  General:  Sleeping but easily arouses to voice alone, oriented, cooperative, no distress  Head:  Normocephalic, without obvious abnormality, atraumatic  Eyes:  Mild conjunctival pallor, EOMs intact  Lungs:   Clear to auscultation bilaterally, respirations unlabored  Heart:  Regular rate and rhythm, S1, S2 normal  Abdomen:   Soft, non-tender, non-distended, bowel sounds active all four quadrants  Extremities: Extremities normal, atraumatic, no  edema    Lab Results: Recent Labs    02/05/21 0724 02/06/21 0242  NA 140 141  K 4.7 3.8  CL 109 110  CO2 25 24  GLUCOSE 333* 150*  BUN 32* 18  CREATININE 1.12 1.03  CALCIUM 8.3* 8.7*   No results for input(s): AST, ALT, ALKPHOS, BILITOT, PROT, ALBUMIN in the last 72 hours. Recent Labs    02/04/21 0549 02/05/21 0724 02/06/21 0242 02/06/21 0918  WBC 6.7 13.2* 13.0*  --   NEUTROABS 3.3 9.3*  --   --   HGB 9.6* 6.9* 9.0* 9.2*  HCT 28.3* 21.0* 26.2* 27.3*  MCV 85.5 88.6 89.7  --   PLT 198 230 205  --    No results for input(s): LABPROT, INR in the last 72 hours.    Assessment: Upper GI Bleeding: EGD 02/05/21 revealed non-bleeding gastric ulcer with a visible vessel, injected and treated with a heater probe.  Most likely NSAID-induced (though H pylori serologies pending to rule out H pylori). -Hgb 9.0,  improved from 6.9 after 2u pRBCs -BUN 18, decreased from 32 yesterday  Plan: Continue Protonix drip.  Full liquid diet today.  If no further signs of bleeding, plan to advance diet further tomorrow.  Continue to monitor H&H with transfusion as needed to maintain Hgb >7.  [facility] will follow.   Edrick Kins PA-C 02/06/2021, 11:23 AM  Contact #  [phone]

## 2021-02-07 DIAGNOSIS — E1365 Other specified diabetes mellitus with hyperglycemia: Secondary | ICD-10-CM

## 2021-02-07 DIAGNOSIS — K922 Gastrointestinal hemorrhage, unspecified: Secondary | ICD-10-CM | POA: Diagnosis not present

## 2021-02-07 DIAGNOSIS — D62 Acute posthemorrhagic anemia: Secondary | ICD-10-CM | POA: Diagnosis not present

## 2021-02-07 DIAGNOSIS — R55 Syncope and collapse: Secondary | ICD-10-CM | POA: Diagnosis not present

## 2021-02-07 LAB — BASIC METABOLIC PANEL
Anion gap: 8 (ref 5–15)
BUN: 15 mg/dL (ref 6–20)
CO2: 25 mmol/L (ref 22–32)
Calcium: 8.7 mg/dL — ABNORMAL LOW (ref 8.9–10.3)
Chloride: 105 mmol/L (ref 98–111)
Creatinine, Ser: 1.14 mg/dL (ref 0.61–1.24)
GFR, Estimated: >60 mL/min (ref >60)
Glucose, Bld: 139 mg/dL — ABNORMAL HIGH (ref 70–99)
Potassium: 3.3 mmol/L — ABNORMAL LOW (ref 3.5–5.1)
Sodium: 138 mmol/L (ref 135–145)

## 2021-02-07 LAB — CBC
HCT: 25.1 % — ABNORMAL LOW (ref 39.0–52.0)
Hemoglobin: 8.4 g/dL — ABNORMAL LOW (ref 13.0–17.0)
MCH: 30 pg (ref 26.0–34.0)
MCHC: 33.5 g/dL (ref 30.0–36.0)
MCV: 89.6 fL (ref 80.0–100.0)
Platelets: 229 10*3/uL (ref 150–400)
RBC: 2.8 MIL/uL — ABNORMAL LOW (ref 4.22–5.81)
RDW: 14.3 % (ref 11.5–15.5)
WBC: 11.2 10*3/uL — ABNORMAL HIGH (ref 4.0–10.5)
nRBC: 0 % (ref 0.0–0.2)

## 2021-02-07 LAB — GLUCOSE, CAPILLARY
Glucose-Capillary: 158 mg/dL — ABNORMAL HIGH (ref 70–99)
Glucose-Capillary: 182 mg/dL — ABNORMAL HIGH (ref 70–99)
Glucose-Capillary: 162 mg/dL — ABNORMAL HIGH (ref 70–99)
Glucose-Capillary: 188 mg/dL — ABNORMAL HIGH (ref 70–99)

## 2021-02-07 MED ORDER — POTASSIUM CHLORIDE CRYS ER 20 MEQ PO TBCR
20.0000 meq | EXTENDED_RELEASE_TABLET | Freq: Once | ORAL | Status: AC
  Administered 2021-02-07: 20 meq via ORAL
  Filled 2021-02-07: qty 1

## 2021-02-07 MED ORDER — LIVING WELL WITH DIABETES BOOK
Freq: Once | Status: AC
  Filled 2021-02-07: qty 1

## 2021-02-07 NOTE — Plan of Care (Signed)

## 2021-02-07 NOTE — Progress Notes (Signed)
[facility] Progress Note  Derek Lawson 35 y.o. September 09, 1986  CC: GI bleed   Subjective: Patient seen and examined at bedside.  Had 1 episode of dark-colored stool today.  According to patient, his stools are getting lighter in color compared to his admission.  Mild epigastric discomfort.  Denies nausea or vomiting.  Tolerating full liquid diet.  ROS : Negative for chest pain and shortness of breath.   Objective: Vital signs in last 24 hours: Vitals:   02/07/21 0646 02/07/21 0817  BP: 128/67   Pulse: 75   Resp:    Temp:  98.5 F (36.9 C)  SpO2: 100%     Physical Exam:  General:  Alert, cooperative, no distress, appears stated age  Head:  Normocephalic, without obvious abnormality, atraumatic  Eyes:  , EOM's intact,   Lungs:   Clear to auscultation bilaterally, respirations unlabored  Heart:  Regular rate and rhythm, S1, S2 normal  Abdomen:   Soft, non-tender, nondistended, bowel sounds present.  No peritoneal sign  Extremities: Extremities normal, atraumatic, no  edema  Pulses: 2+ and symmetric    Lab Results: Recent Labs    02/06/21 0242 02/07/21 0211  NA 141 138  K 3.8 3.3*  CL 110 105  CO2 24 25  GLUCOSE 150* 139*  BUN 18 15  CREATININE 1.03 1.14  CALCIUM 8.7* 8.7*   No results for input(s): AST, ALT, ALKPHOS, BILITOT, PROT, ALBUMIN in the last 72 hours. Recent Labs    02/05/21 0724 02/06/21 0242 02/06/21 0918 02/06/21 1522 02/07/21 0211  WBC 13.2* 13.0*  --   --  11.2*  NEUTROABS 9.3*  --   --   --   --   HGB 6.9* 9.0*   < > 9.0* 8.4*  HCT 21.0* 26.2*   < > 27.4* 25.1*  MCV 88.6 89.7  --   --  89.6  PLT 230 205  --   --  229   < > = values in this interval not displayed.   No results for input(s): LABPROT, INR in the last 72 hours.    Assessment/Plan:  -Upper GI bleed from gastric ulcer with visible vessel.  Status post endoscopic treatment 02/05/2021 -Blood loss anemia.  Status post blood transfusion.    Recommendations   -----------------------  -Mild drop in hemoglobin noted but BUN is trending down.  Patient stools are still dark but getting lighter in color according to patient. -Advance diet to soft -Okay to transfer out of ICU to telemetry - Continue Protonix drip.  -Follow H. pylori serology  -Repeat CBC in the morning -GI will follow    Kathi Der MD, FACP 02/07/2021, 8:58 AM  Contact #  [phone]

## 2021-02-07 NOTE — Progress Notes (Signed)
Inpatient Diabetes Program Recommendations  AACE/ADA: New Consensus Statement on Inpatient Glycemic Control (2015)  Target Ranges:  Prepandial:   less than 140 mg/dL      Peak postprandial:   less than 180 mg/dL (1-2 hours)      Critically ill patients:  140 - 180 mg/dL   Lab Results  Component Value Date   GLUCAP 158 (H) 02/07/2021   HGBA1C 7.7 (H) 02/06/2021    Review of Glycemic Control  Diabetes history: New diagnosis  Current orders for Inpatient glycemic control: Novolog 0-9 units tid  A1c 7.7% on 4/22, not accurate due to low Hgb (level probably slightly higher) [employer] insurance  Inpatient Diabetes Program Recommendations:    Spoke with pt over the phone regarding new dm diagnosis and glucose control at home in the future. He says his dad has diabetes and takes medication. Discussed basic patho of DM type 2, discussed his A1c level and discussed glucose and Ac goals. Discussed possible medication when to take them. Discussed lifestyle modification with diet and exercise. Discussed how to check glucose levels and to check 1-2 times a day. Follow up with PCP.  At time of d/c Farxiga 5 mg Daily Blood glucose kit order #[id number] (glucose checks bid)  Thanks,  Tama Headings RN, MSN, BC-ADM Inpatient Diabetes Coordinator Team Pager [phone] (8a-5p)

## 2021-02-07 NOTE — Progress Notes (Signed)
PROGRESS NOTE    Derek Lawson    Code Status: Full Code  FSF:[id number] DOB: Mar 03, 1986 DOA: 02/05/2021 LOS: 2 days  PCP: Patient, No Pcp Per (Inactive) CC:  Chief Complaint  Patient presents with  . Dizziness  . Fatigue       Hospital Summary   This is a 35 year old male with a past medical history of hypertension, asthma, reported gout who presented to the ED on 4/21 after a syncopal episode at home. He was recently discharged on 4/20 after treatment for a GI bleed thought to be from NSAID use and was discharged home after tolerating PO intake. 4/19: EGD with Dr. Ewing Schlein probable caustic gastritis, tiny hiatal hernia, normal duodenum and otherwise exam unremarkable.    ED course: Afebrile, tachycardic and hemodynamically stable.  WBC 13.2 and Hb 6.9, glucose 333 otherwise labs overall unremarkable.  He was transfused 2 units PRBCs and GI was consulted.  He was started on a Protonix drip and underwent urgent endoscopy  EGD 4/21: Normal esophagus, hematin in the greater curvature the stomach, nonbleeding gastric ulcer with a visible vessel which was injected and treated with a heater probe otherwise unremarkable.  A & P   Principal Problem:   Acute blood loss anemia Active Problems:   Asthma, mild intermittent   Acute upper GI bleed   Syncope   GIB (gastrointestinal bleeding)   1. Acute blood loss anemia secondary to recurrent GI bleed, suspected NSAID induced a. Hemodynamically stable on room air with stable Hb following 2 units PRBCs b. EGD 4/21: Normal esophagus, hematin in the greater curvature the stomach, nonbleeding gastric ulcer with a visible vessel which was injected and treated with a heater probe otherwise unremarkable. c. Follow-up H. Pylori d. Slight drop in Hb today but BUN stable e. Continue Protonix drip f. Okay to advance to soft diet per GI g. Transfer to tele h. Trend H&H and transfuse for Hb <7.0  2. Syncope secondary to GI blood  losses a. Orthostatic vital signs  3. Newly diagnosed Diabetes a. HbA1c 7.7 b. Provided bedside education c. Diabetic coordinator and dietician consult d. Will need TOC consult to help set up a PCP appointment following discharge e. NovoLog sensitive scale 3 times daily with meals and at bedtime  4. Asthma, stable a. Continue albuterol inhaler  5. Leukocytosis, likely reactive   DVT prophylaxis: SCDs Start: 02/05/21 1310   Family Communication: Patient's wife has been updated today  Disposition Plan:  Hopeful discharge in the next 24-48 hours pending GI sign off Status is: Inpatient  Remains inpatient appropriate because:IV treatments appropriate due to intensity of illness or inability to take PO and Inpatient level of care appropriate due to severity of illness   Dispo: The patient is from: Home              Anticipated d/c is to: Home              Patient currently is not medically stable to d/c.   Difficult to place patient No           Pressure injury documentation    None  Consultants  GI   Procedures  4/21 EGD   Antibiotics   Anti-infectives (From admission, onward)   None        Subjective   Admits to some epigastric discomfort today which was new. Had a BM today which was formed and lighter in color than prior. Tolerating diet. He states he has a family history  of diabetes in his father, no personal history of diabetes. We talked about his new diagnosis and all questions/concerns were addressed. No other issues overnight  Objective   Vitals:   02/07/21 0400 02/07/21 0500 02/07/21 0646 02/07/21 0817  BP:   128/67   Pulse:   75   Resp:      Temp: 98.8 F (37.1 C)   98.5 F (36.9 C)  TempSrc: Oral   Oral  SpO2:   100%   Weight:  109.3 kg    Height:        Intake/Output Summary (Last 24 hours) at 02/07/2021 0922 Last data filed at 02/07/2021 0319 Gross per 24 hour  Intake 212.57 ml  Output 650 ml  Net -437.43 ml   Filed Weights    02/05/21 0649 02/07/21 0500  Weight: 104.3 kg 109.3 kg    Examination:  Physical Exam Vitals and nursing note reviewed.  Constitutional:      Appearance: Normal appearance.  HENT:     Head: Normocephalic and atraumatic.  Eyes:     Conjunctiva/sclera: Conjunctivae normal.  Cardiovascular:     Rate and Rhythm: Normal rate and regular rhythm.  Pulmonary:     Effort: Pulmonary effort is normal.     Breath sounds: Normal breath sounds.  Abdominal:     General: Abdomen is flat.     Palpations: Abdomen is soft.  Musculoskeletal:        General: No swelling or tenderness.  Skin:    Coloration: Skin is not jaundiced or pale.  Neurological:     Mental Status: He is alert. Mental status is at baseline.  Psychiatric:        Mood and Affect: Mood normal.        Behavior: Behavior normal.     Data Reviewed: I have personally reviewed following labs and imaging studies  CBC: Recent Labs  Lab 02/02/21 1725 02/02/21 2107 02/03/21 0040 02/03/21 0909 02/04/21 0549 02/05/21 0724 02/06/21 0242 02/06/21 0918 02/06/21 1522 02/07/21 0211  WBC 8.9  --  8.7  --  6.7 13.2* 13.0*  --   --  11.2*  NEUTROABS 6.3  --   --   --  3.3 9.3*  --   --   --   --   HGB 12.4*   < > 11.8*   < > 9.6* 6.9* 9.0* 9.2* 9.0* 8.4*  HCT 36.6*   < > 35.1*   < > 28.3* 21.0* 26.2* 27.3* 27.4* 25.1*  MCV 85.3  --  85.6  --  85.5 88.6 89.7  --   --  89.6  PLT 233  --  248  --  198 230 205  --   --  229   < > = values in this interval not displayed.   Basic Metabolic Panel: Recent Labs  Lab 02/03/21 0040 02/04/21 0446 02/05/21 0724 02/06/21 0242 02/07/21 0211  NA 141 140 140 141 138  K 3.8 3.2* 4.7 3.8 3.3*  CL 106 110 109 110 105  CO2 23 24 25 24 25   GLUCOSE 185* 153* 333* 150* 139*  BUN 20 20 32* 18 15  CREATININE 0.95 0.83 1.12 1.03 1.14  CALCIUM 9.4 7.5* 8.3* 8.7* 8.7*  MG 2.0  --   --   --   --    GFR: Estimated Creatinine Clearance: 110.9 mL/min (by C-G formula based on SCr of 1.14  mg/dL). Liver Function Tests: Recent Labs  Lab 02/02/21 1725 02/03/21 0040  AST 25  22  ALT 71* 63*  ALKPHOS 63 60  BILITOT 0.7 0.5  PROT 7.4 6.9  ALBUMIN 4.2 3.9   No results for input(s): LIPASE, AMYLASE in the last 168 hours. No results for input(s): AMMONIA in the last 168 hours. Coagulation Profile: Recent Labs  Lab 02/02/21 1725 02/03/21 0040  INR 1.0 1.0   Cardiac Enzymes: No results for input(s): CKTOTAL, CKMB, CKMBINDEX, TROPONINI in the last 168 hours. BNP (last 3 results) No results for input(s): PROBNP in the last 8760 hours. HbA1C: Recent Labs    02/06/21 0918  HGBA1C 7.7*   CBG: Recent Labs  Lab 02/06/21 1625 02/06/21 2120 02/07/21 0737  GLUCAP 156* 130* 158*   Lipid Profile: No results for input(s): CHOL, HDL, LDLCALC, TRIG, CHOLHDL, LDLDIRECT in the last 72 hours. Thyroid Function Tests: No results for input(s): TSH, T4TOTAL, FREET4, T3FREE, THYROIDAB in the last 72 hours. Anemia Panel: No results for input(s): VITAMINB12, FOLATE, FERRITIN, TIBC, IRON, RETICCTPCT in the last 72 hours. Sepsis Labs: No results for input(s): PROCALCITON, LATICACIDVEN in the last 168 hours.  Recent Results (from the past 240 hour(s))  Resp Panel by RT-PCR (Flu A&B, Covid) Nasopharyngeal Swab     Status: None   Collection Time: 02/02/21  6:48 PM   Specimen: Nasopharyngeal Swab; Nasopharyngeal(NP) swabs in vial transport medium  Result Value Ref Range Status   SARS Coronavirus 2 by RT PCR NEGATIVE NEGATIVE Final    Comment: (NOTE) SARS-CoV-2 target nucleic acids are NOT DETECTED.  The SARS-CoV-2 RNA is generally detectable in upper respiratory specimens during the acute phase of infection. The lowest concentration of SARS-CoV-2 viral copies this assay can detect is 138 copies/mL. A negative result does not preclude SARS-Cov-2 infection and should not be used as the sole basis for treatment or other patient management decisions. A negative result may occur with   improper specimen collection/handling, submission of specimen other than nasopharyngeal swab, presence of viral mutation(s) within the areas targeted by this assay, and inadequate number of viral copies(<138 copies/mL). A negative result must be combined with clinical observations, patient history, and epidemiological information. The expected result is Negative.  Fact Sheet for Patients:  BloggerCourse.com  Fact Sheet for Healthcare Providers:  SeriousBroker.it  This test is no t yet approved or cleared by the [place] FDA and  has been authorized for detection and/or diagnosis of SARS-CoV-2 by FDA under an Emergency Use Authorization (EUA). This EUA will remain  in effect (meaning this test can be used) for the duration of the COVID-19 declaration under Section 564(b)(1) of the Act, 21 U.S.C.section 360bbb-3(b)(1), unless the authorization is terminated  or revoked sooner.       Influenza A by PCR NEGATIVE NEGATIVE Final   Influenza B by PCR NEGATIVE NEGATIVE Final    Comment: (NOTE) The Xpert Xpress SARS-CoV-2/FLU/RSV plus assay is intended as an aid in the diagnosis of influenza from Nasopharyngeal swab specimens and should not be used as a sole basis for treatment. Nasal washings and aspirates are unacceptable for Xpert Xpress SARS-CoV-2/FLU/RSV testing.  Fact Sheet for Patients: BloggerCourse.com  Fact Sheet for Healthcare Providers: SeriousBroker.it  This test is not yet approved or cleared by the [place] FDA and has been authorized for detection and/or diagnosis of SARS-CoV-2 by FDA under an Emergency Use Authorization (EUA). This EUA will remain in effect (meaning this test can be used) for the duration of the COVID-19 declaration under Section 564(b)(1) of the Act, 21 U.S.C. section 360bbb-3(b)(1), unless the authorization is  terminated  or revoked.  Performed at [facility], [address], [place], [place] [address]   Resp Panel by RT-PCR (Flu A&B, Covid) Nasopharyngeal Swab     Status: None   Collection Time: 02/05/21  9:14 AM   Specimen: Nasopharyngeal Swab; Nasopharyngeal(NP) swabs in vial transport medium  Result Value Ref Range Status   SARS Coronavirus 2 by RT PCR NEGATIVE NEGATIVE Final    Comment: (NOTE) SARS-CoV-2 target nucleic acids are NOT DETECTED.  The SARS-CoV-2 RNA is generally detectable in upper respiratory specimens during the acute phase of infection. The lowest concentration of SARS-CoV-2 viral copies this assay can detect is 138 copies/mL. A negative result does not preclude SARS-Cov-2 infection and should not be used as the sole basis for treatment or other patient management decisions. A negative result may occur with  improper specimen collection/handling, submission of specimen other than nasopharyngeal swab, presence of viral mutation(s) within the areas targeted by this assay, and inadequate number of viral copies(<138 copies/mL). A negative result must be combined with clinical observations, patient history, and epidemiological information. The expected result is Negative.  Fact Sheet for Patients:  BloggerCourse.com  Fact Sheet for Healthcare Providers:  SeriousBroker.it  This test is no t yet approved or cleared by the [place] FDA and  has been authorized for detection and/or diagnosis of SARS-CoV-2 by FDA under an Emergency Use Authorization (EUA). This EUA will remain  in effect (meaning this test can be used) for the duration of the COVID-19 declaration under Section 564(b)(1) of the Act, 21 U.S.C.section 360bbb-3(b)(1), unless the authorization is terminated  or revoked sooner.       Influenza A by PCR NEGATIVE NEGATIVE Final   Influenza B by PCR NEGATIVE NEGATIVE Final    Comment: (NOTE) The  Xpert Xpress SARS-CoV-2/FLU/RSV plus assay is intended as an aid in the diagnosis of influenza from Nasopharyngeal swab specimens and should not be used as a sole basis for treatment. Nasal washings and aspirates are unacceptable for Xpert Xpress SARS-CoV-2/FLU/RSV testing.  Fact Sheet for Patients: BloggerCourse.com  Fact Sheet for Healthcare Providers: SeriousBroker.it  This test is not yet approved or cleared by the [place] FDA and has been authorized for detection and/or diagnosis of SARS-CoV-2 by FDA under an Emergency Use Authorization (EUA). This EUA will remain in effect (meaning this test can be used) for the duration of the COVID-19 declaration under Section 564(b)(1) of the Act, 21 U.S.C. section 360bbb-3(b)(1), unless the authorization is terminated or revoked.  Performed at [facility], [address], [place], [place] [address]   MRSA PCR Screening     Status: Abnormal   Collection Time: 02/05/21  1:17 PM   Specimen: Nasopharyngeal  Result Value Ref Range Status   MRSA by PCR POSITIVE (A) NEGATIVE Final    Comment:        The GeneXpert MRSA Assay (FDA approved for NASAL specimens only), is one component of a comprehensive MRSA colonization surveillance program. It is not intended to diagnose MRSA infection nor to guide or monitor treatment for MRSA infections. RESULT CALLED TO, READ BACK BY AND VERIFIED WITH: A.HEAVNER, RN AT 1621 ON 04.21.22 BY N.[email or web address] Performed at [facility], [address], [place], [place] [address]          Radiology Studies: No results found.      Scheduled Meds: . Chlorhexidine Gluconate Cloth  6 each Topical Daily  . insulin aspart  0-5 Units Subcutaneous QHS  .  insulin aspart  0-9 Units Subcutaneous TID WC  . mupirocin ointment   Nasal BID  . sodium chloride flush  3 mL Intravenous Q12H   Continuous Infusions: .  sodium chloride    . lactated ringers Stopped (02/05/21 1330)  . pantoprozole (PROTONIX) infusion 8 mg/hr (02/07/21 0319)     Time spent: 26 minutes with over 50% of the time coordinating the patient's care    Derek DireJared E Britain Anagnos, DO [facility]   Call night coverage person covering after 7pm

## 2021-02-08 DIAGNOSIS — E669 Obesity, unspecified: Secondary | ICD-10-CM | POA: Insufficient documentation

## 2021-02-08 DIAGNOSIS — I152 Hypertension secondary to endocrine disorders: Secondary | ICD-10-CM | POA: Insufficient documentation

## 2021-02-08 DIAGNOSIS — E1159 Type 2 diabetes mellitus with other circulatory complications: Secondary | ICD-10-CM | POA: Insufficient documentation

## 2021-02-08 DIAGNOSIS — D62 Acute posthemorrhagic anemia: Secondary | ICD-10-CM | POA: Diagnosis not present

## 2021-02-08 DIAGNOSIS — E1169 Type 2 diabetes mellitus with other specified complication: Secondary | ICD-10-CM | POA: Insufficient documentation

## 2021-02-08 LAB — CBC
HCT: 25.6 % — ABNORMAL LOW (ref 39.0–52.0)
Hemoglobin: 8.6 g/dL — ABNORMAL LOW (ref 13.0–17.0)
MCH: 30.3 pg (ref 26.0–34.0)
MCHC: 33.6 g/dL (ref 30.0–36.0)
MCV: 90.1 fL (ref 80.0–100.0)
Platelets: 271 10*3/uL (ref 150–400)
RBC: 2.84 MIL/uL — ABNORMAL LOW (ref 4.22–5.81)
RDW: 14.6 % (ref 11.5–15.5)
WBC: 10.2 10*3/uL (ref 4.0–10.5)
nRBC: 0 % (ref 0.0–0.2)

## 2021-02-08 LAB — GLUCOSE, CAPILLARY: Glucose-Capillary: 170 mg/dL — ABNORMAL HIGH (ref 70–99)

## 2021-02-08 MED ORDER — BLOOD GLUCOSE METER KIT: PACK | 0 refills | Status: AC

## 2021-02-08 MED ORDER — PANTOPRAZOLE SODIUM 40 MG IV SOLR: 40.0000 mg | Freq: Two times a day (BID) | INTRAVENOUS | Status: DC

## 2021-02-08 MED ORDER — CHLORHEXIDINE GLUCONATE CLOTH 2 % EX PADS
6.0000 | MEDICATED_PAD | Freq: Every day | CUTANEOUS | Status: DC
  Administered 2021-02-08: 6 via TOPICAL

## 2021-02-08 MED ORDER — PANTOPRAZOLE SODIUM 40 MG PO TBEC: DELAYED_RELEASE_TABLET | ORAL | 0 refills | Status: DC

## 2021-02-08 MED ORDER — DAPAGLIFLOZIN PROPANEDIOL 5 MG PO TABS
5.0000 mg | ORAL_TABLET | Freq: Every day | ORAL | 1 refills | Status: DC
Start: 2021-02-08 — End: 2022-06-23

## 2021-02-08 MED ORDER — PANTOPRAZOLE SODIUM 40 MG PO TBEC: 40.0000 mg | DELAYED_RELEASE_TABLET | Freq: Two times a day (BID) | ORAL | Status: DC

## 2021-02-08 NOTE — Progress Notes (Signed)
[facility] Progress Note  Derek Lawson 35 y.o. [birth year]  CC: GI bleed   Subjective: Patient seen and examined at bedside.  Wife at bedside.  No further bleeding episodes.  Currently having regular colored stool.  Denies abdominal pain, nausea and vomiting.  Tolerating diet.Marland Kitchen  ROS : Negative for chest pain and shortness of breath.   Objective: Vital signs in last 24 hours: Vitals:   02/07/21 2010 02/08/21 0603  BP: (!) 135/103 (!) 142/95  Pulse: 92 77  Resp:  16  Temp: 99 F (37.2 C) 99.1 F (37.3 C)  SpO2: 99% 98%    Physical Exam:  General:  Alert, cooperative, no distress, appears stated age  Head:  Normocephalic, without obvious abnormality, atraumatic  Eyes:  , EOM's intact,   Lungs:   Clear to auscultation bilaterally, respirations unlabored  Heart:  Regular rate and rhythm, S1, S2 normal  Abdomen:   Soft, non-tender, nondistended, bowel sounds present.  No peritoneal sign  Extremities: Extremities normal, atraumatic, no  edema  Pulses: 2+ and symmetric    Lab Results: Recent Labs    02/06/21 0242 02/07/21 0211  NA 141 138  K 3.8 3.3*  CL 110 105  CO2 24 25  GLUCOSE 150* 139*  BUN 18 15  CREATININE 1.03 1.14  CALCIUM 8.7* 8.7*   No results for input(s): AST, ALT, ALKPHOS, BILITOT, PROT, ALBUMIN in the last 72 hours. Recent Labs    02/07/21 0211 02/08/21 0654  WBC 11.2* 10.2  HGB 8.4* 8.6*  HCT 25.1* 25.6*  MCV 89.6 90.1  PLT 229 271   No results for input(s): LABPROT, INR in the last 72 hours.    Assessment/Plan:  -Upper GI bleed from gastric ulcer with visible vessel.  Status post endoscopic treatment 02/05/2021 -Blood loss anemia.  Status post blood transfusion.    Recommendations  -----------------------  -Patient's hemoglobin is stable.  BUN is trending down.  No further bleeding episodes. -Okay to discharge home from GI standpoint. -Recommend Protonix 40 mg twice a day for 4 weeks followed by Protonix 40 mg  once a day for another 4 weeks. -Avoid NSAIDs, smoking and alcohol use. -Recommend repeat EGD in 2 to 3 months to document healing of gastric ulcer. -Follow H. pylori serology .  Order was discontinued by mistake another order placed.  If not able to get H. pylori serology, consider gastric biopsies during repeat EGD. -Follow-up in GI clinic in 6 weeks.  GI will sign off.  Call us back if needed   Kathi Der MD, FACP 02/08/2021, 9:25 AM  Contact #  [phone]

## 2021-02-08 NOTE — Progress Notes (Signed)
Reviewed discharge instructions with patient and spouse. Medications and prescriptions reviewed and all questions answered.  Patient verbalized understanding of instructions. Trenika Hudson, Yancey Flemings, RN

## 2021-02-08 NOTE — Discharge Summary (Signed)
Physician Discharge Summary  Derek Lawson NOT:[id number] DOB: [birth year]   PCP: Patient, No Pcp Per (Inactive)  Admit date: 02/05/2021 Discharge date: 02/08/2021 Length of Stay: 3 days   Code Status: Full Code  Admitted From:  Home Discharged to:   [place]:  None  Equipment/Devices:  None Discharge Condition:  Stable  Recommendations for Outpatient Follow-up   1. Follow up with PCP in 1 week 2. Started on Farxiga for newly diagnosed DM. Follow up glucose reads 3. Follow up CBC  4. Protonix 40 mg BID x 4 weeks then QD x 4 weeks 5. GI follow up in 6 weeks, need to follow up on H pylori. According to GI, order was discontinued by mistake another order placed.  If not able to get H. pylori serology, consider gastric biopsies during repeat EGD.  Hospital Summary  This is a 35 year old male with a past medical history of hypertension, asthma, reported gout who presented to the ED on 4/21 after a syncopal episode at home. He was recently discharged on 4/20 after treatment for a GI bleed thought to be from NSAID use and was discharged home after tolerating PO intake. 4/19: EGD with Dr. Watt Climes probable caustic gastritis, tiny hiatal hernia, normal duodenum and otherwise exam unremarkable.    ED course: Afebrile, tachycardic and hemodynamically stable.  WBC 13.2 and Hb 6.9, glucose 333 otherwise labs overall unremarkable.  He was transfused 2 units PRBCs and GI was consulted.  He was started on a Protonix drip and underwent urgent endoscopy  EGD 4/21: Normal esophagus, hematin in the greater curvature the stomach, nonbleeding gastric ulcer with a visible vessel which was injected and treated with a heater probe otherwise unremarkable.  He was initially admitted to the SDU and continued on a protonix drip and transitioned to oral therapy on 4/24. No recurrent bleeding and tolerated PO intake. Discharged in stable condition with labs and GI follow up in 6 weeks.  Of note, his HbA1c  was 7.7 which is a new diagnosis of DM for him. He was seen by the diabetic coordinator and discharged with [name] and glucometer kit.  A & P   Principal Problem:   Acute blood loss anemia Active Problems:   Asthma, mild intermittent   Acute upper GI bleed   Syncope   GIB (gastrointestinal bleeding)   Obesity, diabetes, and hypertension syndrome ([place])     1. Acute blood loss anemia secondary to recurrent GI bleed s/p 2 u PRBCs, suspected NSAID induced, resolved a. EGD 4/21: Normal esophagus, hematin in the greater curvature the stomach, nonbleeding gastric ulcer with a visible vessel which was injected and treated with a heater probe otherwise unremarkable. b. Follow-up H. Pylori c. Avoid NSAIDs, tobacco, alcohol d. Protonix 40 mg BID for 4 weeks then QD for 4 weeks and GI follow up in 6 weeks.  2. Syncope secondary to GI blood losses, resolved  3. Newly diagnosed Diabetes a. HbA1c 7.7 b. Provided bedside education c. Diabetic coordinator and dietician consult: Farxiga 5 mg QD and glucometer to check glucose 1-2 times daily  4. Asthma, stable a. Continue albuterol inhaler  5. Leukocytosis, likely reactive     Consultants  . GI  Procedures  . EGD 4/21  Antibiotics   Anti-infectives (From admission, onward)   None       Subjective  Patient seen and examined at bedside no acute distress and resting comfortably.  No events overnight.  Tolerating diet. In good spirits and anticipating discharge.  Denies any chest pain, shortness of breath, fever, nausea, vomiting, urinary or bowel complaints. No further bleeding. Otherwise ROS negative    Objective   Discharge Exam: Vitals:   02/07/21 2010 02/08/21 0603  BP: (!) 135/103 (!) 142/95  Pulse: 92 77  Resp:  16  Temp: 99 F (37.2 C) 99.1 F (37.3 C)  SpO2: 99% 98%   Vitals:   02/07/21 1548 02/07/21 1748 02/07/21 2010 02/08/21 0603  BP: (!) 155/91 (!) 153/94 (!) 135/103 (!) 142/95  Pulse: 95 96 92  77  Resp: $Remo'19 17  16  'fyZnu$ Temp: 98.2 F (36.8 C) 98.4 F (36.9 C) 99 F (37.2 C) 99.1 F (37.3 C)  TempSrc: Oral Oral Oral   SpO2: 100% 100% 99% 98%  Weight:      Height:        Physical Exam Vitals and nursing note reviewed.  Constitutional:      Appearance: Normal appearance.  HENT:     Head: Normocephalic and atraumatic.  Eyes:     Conjunctiva/sclera: Conjunctivae normal.  Cardiovascular:     Rate and Rhythm: Normal rate and regular rhythm.  Pulmonary:     Effort: Pulmonary effort is normal.     Breath sounds: Normal breath sounds.  Abdominal:     General: Abdomen is flat.     Palpations: Abdomen is soft.  Musculoskeletal:        General: No swelling or tenderness.  Skin:    Coloration: Skin is not jaundiced or pale.  Neurological:     Mental Status: He is alert. Mental status is at baseline.  Psychiatric:        Mood and Affect: Mood normal.        Behavior: Behavior normal.       The results of significant diagnostics from this hospitalization (including imaging, microbiology, ancillary and laboratory) are listed below for reference.     Microbiology: Recent Results (from the past 240 hour(s))  Resp Panel by RT-PCR (Flu A&B, Covid) Nasopharyngeal Swab     Status: None   Collection Time: 02/02/21  6:48 PM   Specimen: Nasopharyngeal Swab; Nasopharyngeal(NP) swabs in vial transport medium  Result Value Ref Range Status   SARS Coronavirus 2 by RT PCR NEGATIVE NEGATIVE Final    Comment: (NOTE) SARS-CoV-2 target nucleic acids are NOT DETECTED.  The SARS-CoV-2 RNA is generally detectable in upper respiratory specimens during the acute phase of infection. The lowest concentration of SARS-CoV-2 viral copies this assay can detect is 138 copies/mL. A negative result does not preclude SARS-Cov-2 infection and should not be used as the sole basis for treatment or other patient management decisions. A negative result may occur with  improper specimen  collection/handling, submission of specimen other than nasopharyngeal swab, presence of viral mutation(s) within the areas targeted by this assay, and inadequate number of viral copies(<138 copies/mL). A negative result must be combined with clinical observations, patient history, and epidemiological information. The expected result is Negative.  Fact Sheet for Patients:  EntrepreneurPulse.com.au  Fact Sheet for Healthcare Providers:  IncredibleEmployment.be  This test is no t yet approved or cleared by the [place] FDA and  has been authorized for detection and/or diagnosis of SARS-CoV-2 by FDA under an Emergency Use Authorization (EUA). This EUA will remain  in effect (meaning this test can be used) for the duration of the COVID-19 declaration under Section 564(b)(1) of the Act, 21 U.S.C.section 360bbb-3(b)(1), unless the authorization is terminated  or revoked sooner.  Influenza A by PCR NEGATIVE NEGATIVE Final   Influenza B by PCR NEGATIVE NEGATIVE Final    Comment: (NOTE) The Xpert Xpress SARS-CoV-2/FLU/RSV plus assay is intended as an aid in the diagnosis of influenza from Nasopharyngeal swab specimens and should not be used as a sole basis for treatment. Nasal washings and aspirates are unacceptable for Xpert Xpress SARS-CoV-2/FLU/RSV testing.  Fact Sheet for Patients: EntrepreneurPulse.com.au  Fact Sheet for Healthcare Providers: IncredibleEmployment.be  This test is not yet approved or cleared by the [place] FDA and has been authorized for detection and/or diagnosis of SARS-CoV-2 by FDA under an Emergency Use Authorization (EUA). This EUA will remain in effect (meaning this test can be used) for the duration of the COVID-19 declaration under Section 564(b)(1) of the Act, 21 U.S.C. section 360bbb-3(b)(1), unless the authorization is terminated or revoked.  Performed at [facility], [place] [address], [place], [place] [address]   Resp Panel by RT-PCR (Flu A&B, Covid) Nasopharyngeal Swab     Status: None   Collection Time: 02/05/21  9:14 AM   Specimen: Nasopharyngeal Swab; Nasopharyngeal(NP) swabs in vial transport medium  Result Value Ref Range Status   SARS Coronavirus 2 by RT PCR NEGATIVE NEGATIVE Final    Comment: (NOTE) SARS-CoV-2 target nucleic acids are NOT DETECTED.  The SARS-CoV-2 RNA is generally detectable in upper respiratory specimens during the acute phase of infection. The lowest concentration of SARS-CoV-2 viral copies this assay can detect is 138 copies/mL. A negative result does not preclude SARS-Cov-2 infection and should not be used as the sole basis for treatment or other patient management decisions. A negative result may occur with  improper specimen collection/handling, submission of specimen other than nasopharyngeal swab, presence of viral mutation(s) within the areas targeted by this assay, and inadequate number of viral copies(<138 copies/mL). A negative result must be combined with clinical observations, patient history, and epidemiological information. The expected result is Negative.  Fact Sheet for Patients:  EntrepreneurPulse.com.au  Fact Sheet for Healthcare Providers:  IncredibleEmployment.be  This test is no t yet approved or cleared by the [place] FDA and  has been authorized for detection and/or diagnosis of SARS-CoV-2 by FDA under an Emergency Use Authorization (EUA). This EUA will remain  in effect (meaning this test can be used) for the duration of the COVID-19 declaration under Section 564(b)(1) of the Act, 21 U.S.C.section 360bbb-3(b)(1), unless the authorization is terminated  or revoked sooner.       Influenza A by PCR NEGATIVE NEGATIVE Final   Influenza B by PCR NEGATIVE NEGATIVE Final    Comment: (NOTE) The Xpert Xpress SARS-CoV-2/FLU/RSV plus  assay is intended as an aid in the diagnosis of influenza from Nasopharyngeal swab specimens and should not be used as a sole basis for treatment. Nasal washings and aspirates are unacceptable for Xpert Xpress SARS-CoV-2/FLU/RSV testing.  Fact Sheet for Patients: EntrepreneurPulse.com.au  Fact Sheet for Healthcare Providers: IncredibleEmployment.be  This test is not yet approved or cleared by the [place] FDA and has been authorized for detection and/or diagnosis of SARS-CoV-2 by FDA under an Emergency Use Authorization (EUA). This EUA will remain in effect (meaning this test can be used) for the duration of the COVID-19 declaration under Section 564(b)(1) of the Act, 21 U.S.C. section 360bbb-3(b)(1), unless the authorization is terminated or revoked.  Performed at [facility], [place] [address], [place], [place] [address]   MRSA PCR Screening     Status: Abnormal   Collection Time:  02/05/21  1:17 PM   Specimen: Nasopharyngeal  Result Value Ref Range Status   MRSA by PCR POSITIVE (A) NEGATIVE Final    Comment:        The GeneXpert MRSA Assay (FDA approved for NASAL specimens only), is one component of a comprehensive MRSA colonization surveillance program. It is not intended to diagnose MRSA infection nor to guide or monitor treatment for MRSA infections. RESULT CALLED TO, READ BACK BY AND VERIFIED WITH: A.HEAVNER, RN AT 1621 ON 04.21.22 BY N.THOMPSON Performed at [facility] [address] [place], [place] [address]      Labs: BNP (last 3 results) No results for input(s): BNP in the last 8760 hours. Basic Metabolic Panel: Recent Labs  Lab 02/03/21 0040 02/04/21 0446 02/05/21 0724 02/06/21 0242 02/07/21 0211  NA 141 140 140 141 138  K 3.8 3.2* 4.7 3.8 3.3*  CL 106 110 109 110 105  CO2 $Re'23 24 25 24 25  'boo$ GLUCOSE 185* 153* 333* 150* 139*  BUN 20 20 32* 18 15  CREATININE 0.95 0.83  1.12 1.03 1.14  CALCIUM 9.4 7.5* 8.3* 8.7* 8.7*  MG 2.0  --   --   --   --    Liver Function Tests: Recent Labs  Lab 02/02/21 1725 02/03/21 0040  AST 25 22  ALT 71* 63*  ALKPHOS 63 60  BILITOT 0.7 0.5  PROT 7.4 6.9  ALBUMIN 4.2 3.9   No results for input(s): LIPASE, AMYLASE in the last 168 hours. No results for input(s): AMMONIA in the last 168 hours. CBC: Recent Labs  Lab 02/02/21 1725 02/02/21 2107 02/04/21 0549 02/05/21 0724 02/06/21 0242 02/06/21 0918 02/06/21 1522 02/07/21 0211 02/08/21 0654  WBC 8.9   < > 6.7 13.2* 13.0*  --   --  11.2* 10.2  NEUTROABS 6.3  --  3.3 9.3*  --   --   --   --   --   HGB 12.4*   < > 9.6* 6.9* 9.0* 9.2* 9.0* 8.4* 8.6*  HCT 36.6*   < > 28.3* 21.0* 26.2* 27.3* 27.4* 25.1* 25.6*  MCV 85.3   < > 85.5 88.6 89.7  --   --  89.6 90.1  PLT 233   < > 198 230 205  --   --  229 271   < > = values in this interval not displayed.   Cardiac Enzymes: No results for input(s): CKTOTAL, CKMB, CKMBINDEX, TROPONINI in the last 168 hours. BNP: Invalid input(s): POCBNP CBG: Recent Labs  Lab 02/07/21 0737 02/07/21 1214 02/07/21 1628 02/07/21 2138 02/08/21 0804  GLUCAP 158* 182* 162* 188* 170*   D-Dimer No results for input(s): DDIMER in the last 72 hours. Hgb A1c Recent Labs    02/06/21 0918  HGBA1C 7.7*   Lipid Profile No results for input(s): CHOL, HDL, LDLCALC, TRIG, CHOLHDL, LDLDIRECT in the last 72 hours. Thyroid function studies No results for input(s): TSH, T4TOTAL, T3FREE, THYROIDAB in the last 72 hours.  Invalid input(s): FREET3 Anemia work up No results for input(s): VITAMINB12, FOLATE, FERRITIN, TIBC, IRON, RETICCTPCT in the last 72 hours. Urinalysis    Component Value Date/Time   COLORURINE YELLOW 09/08/2015 [place] 09/08/2015 1136   LABSPEC 1.025 08/18/2020 1748   PHURINE 6.0 08/18/2020 [place] 08/18/2020 1748   HGBUR NEGATIVE 08/18/2020 [place] 08/18/2020 [place] (A) 08/18/2020 1748   PROTEINUR 30 (A) 08/18/2020 1748  UROBILINOGEN 0.2 08/18/2020 1748   NITRITE NEGATIVE 08/18/2020 1748   LEUKOCYTESUR NEGATIVE 08/18/2020 1748   Sepsis Labs Invalid input(s): PROCALCITONIN,  WBC,  LACTICIDVEN Microbiology Recent Results (from the past 240 hour(s))  Resp Panel by RT-PCR (Flu A&B, Covid) Nasopharyngeal Swab     Status: None   Collection Time: 02/02/21  6:48 PM   Specimen: Nasopharyngeal Swab; Nasopharyngeal(NP) swabs in vial transport medium  Result Value Ref Range Status   SARS Coronavirus 2 by RT PCR NEGATIVE NEGATIVE Final    Comment: (NOTE) SARS-CoV-2 target nucleic acids are NOT DETECTED.  The SARS-CoV-2 RNA is generally detectable in upper respiratory specimens during the acute phase of infection. The lowest concentration of SARS-CoV-2 viral copies this assay can detect is 138 copies/mL. A negative result does not preclude SARS-Cov-2 infection and should not be used as the sole basis for treatment or other patient management decisions. A negative result may occur with  improper specimen collection/handling, submission of specimen other than nasopharyngeal swab, presence of viral mutation(s) within the areas targeted by this assay, and inadequate number of viral copies(<138 copies/mL). A negative result must be combined with clinical observations, patient history, and epidemiological information. The expected result is Negative.  Fact Sheet for Patients:  EntrepreneurPulse.com.au  Fact Sheet for Healthcare Providers:  IncredibleEmployment.be  This test is no t yet approved or cleared by the [place] FDA and  has been authorized for detection and/or diagnosis of SARS-CoV-2 by FDA under an Emergency Use Authorization (EUA). This EUA will remain  in effect (meaning this test can be used) for the duration of the COVID-19 declaration under Section 564(b)(1) of the Act,  21 U.S.C.section 360bbb-3(b)(1), unless the authorization is terminated  or revoked sooner.       Influenza A by PCR NEGATIVE NEGATIVE Final   Influenza B by PCR NEGATIVE NEGATIVE Final    Comment: (NOTE) The Xpert Xpress SARS-CoV-2/FLU/RSV plus assay is intended as an aid in the diagnosis of influenza from Nasopharyngeal swab specimens and should not be used as a sole basis for treatment. Nasal washings and aspirates are unacceptable for Xpert Xpress SARS-CoV-2/FLU/RSV testing.  Fact Sheet for Patients: EntrepreneurPulse.com.au  Fact Sheet for Healthcare Providers: IncredibleEmployment.be  This test is not yet approved or cleared by the [place] FDA and has been authorized for detection and/or diagnosis of SARS-CoV-2 by FDA under an Emergency Use Authorization (EUA). This EUA will remain in effect (meaning this test can be used) for the duration of the COVID-19 declaration under Section 564(b)(1) of the Act, 21 U.S.C. section 360bbb-3(b)(1), unless the authorization is terminated or revoked.  Performed at [facility], [place] [address] [place], [place] [address]   Resp Panel by RT-PCR (Flu A&B, Covid) Nasopharyngeal Swab     Status: None   Collection Time: 02/05/21  9:14 AM   Specimen: Nasopharyngeal Swab; Nasopharyngeal(NP) swabs in vial transport medium  Result Value Ref Range Status   SARS Coronavirus 2 by RT PCR NEGATIVE NEGATIVE Final    Comment: (NOTE) SARS-CoV-2 target nucleic acids are NOT DETECTED.  The SARS-CoV-2 RNA is generally detectable in upper respiratory specimens during the acute phase of infection. The lowest concentration of SARS-CoV-2 viral copies this assay can detect is 138 copies/mL. A negative result does not preclude SARS-Cov-2 infection and should not be used as the sole basis for treatment or other patient management decisions. A negative result may occur with  improper specimen  collection/handling, submission of specimen other than nasopharyngeal swab, presence of  viral mutation(s) within the areas targeted by this assay, and inadequate number of viral copies(<138 copies/mL). A negative result must be combined with clinical observations, patient history, and epidemiological information. The expected result is Negative.  Fact Sheet for Patients:  EntrepreneurPulse.com.au  Fact Sheet for Healthcare Providers:  IncredibleEmployment.be  This test is no t yet approved or cleared by the [place] FDA and  has been authorized for detection and/or diagnosis of SARS-CoV-2 by FDA under an Emergency Use Authorization (EUA). This EUA will remain  in effect (meaning this test can be used) for the duration of the COVID-19 declaration under Section 564(b)(1) of the Act, 21 U.S.C.section 360bbb-3(b)(1), unless the authorization is terminated  or revoked sooner.       Influenza A by PCR NEGATIVE NEGATIVE Final   Influenza B by PCR NEGATIVE NEGATIVE Final    Comment: (NOTE) The Xpert Xpress SARS-CoV-2/FLU/RSV plus assay is intended as an aid in the diagnosis of influenza from Nasopharyngeal swab specimens and should not be used as a sole basis for treatment. Nasal washings and aspirates are unacceptable for Xpert Xpress SARS-CoV-2/FLU/RSV testing.  Fact Sheet for Patients: EntrepreneurPulse.com.au  Fact Sheet for Healthcare Providers: IncredibleEmployment.be  This test is not yet approved or cleared by the [place] FDA and has been authorized for detection and/or diagnosis of SARS-CoV-2 by FDA under an Emergency Use Authorization (EUA). This EUA will remain in effect (meaning this test can be used) for the duration of the COVID-19 declaration under Section 564(b)(1) of the Act, 21 U.S.C. section 360bbb-3(b)(1), unless the authorization is terminated or revoked.  Performed at [facility], [place] [address], [place], [place] [address]   MRSA PCR Screening     Status: Abnormal   Collection Time: 02/05/21  1:17 PM   Specimen: Nasopharyngeal  Result Value Ref Range Status   MRSA by PCR POSITIVE (A) NEGATIVE Final    Comment:        The GeneXpert MRSA Assay (FDA approved for NASAL specimens only), is one component of a comprehensive MRSA colonization surveillance program. It is not intended to diagnose MRSA infection nor to guide or monitor treatment for MRSA infections. RESULT CALLED TO, READ BACK BY AND VERIFIED WITH: A.HEAVNER, RN AT 1621 ON 04.21.22 BY N.THOMPSON Performed at [facility] [address], [place], [place] [address]     Discharge Instructions     Discharge Instructions    Diet - low sodium heart healthy   Complete by: As directed    Diet Carb Modified   Complete by: As directed    Discharge instructions   Complete by: As directed    - Take Protonix twice daily  - Start Farxiga 5 mg daily for new diagnosis of diabetes - Follow up with a primary care physician in the next 1-2 weeks after discharge - Check your blood glucose levels 1-2 times daily and record them to bring to your primary care visit - Follow up with GI in the office - Do not take NSAIDs: Advil, ibuprofen, Aleve, naproxen, Aspirin, etc. - Avoid alcohol and tobacco  If you have any significant change or worsening of your symptoms do not hesitate to contact your PCP or return to the ED   Increase activity slowly   Complete by: As directed      Allergies as of 02/08/2021      Reactions   Charentais Melon (french Melon) Hives   Honeydew melon   Shellfish Allergy Hives   Sulfa  Antibiotics Other (See Comments)   unknown      Medication List    TAKE these medications   albuterol 108 (90 Base) MCG/ACT inhaler Commonly known as: VENTOLIN HFA Inhale 1-2 puffs into the lungs every 6 (six) hours as needed for wheezing or shortness of  breath.   blood glucose meter kit and supplies Dispense based on patient and insurance preference. Use up to four times daily as directed. (FOR ICD-10 E10.9, E11.9).   cetirizine 10 MG chewable tablet Commonly known as: ZYRTEC Chew 10 mg by mouth daily.   dapagliflozin propanediol 5 MG Tabs tablet Commonly known as: Farxiga Take 1 tablet (5 mg total) by mouth daily before breakfast.   lisinopril 10 MG tablet Commonly known as: ZESTRIL Take 1 tablet (10 mg total) by mouth daily.   multivitamin with minerals Tabs tablet Take 1 tablet by mouth daily.   pantoprazole 40 MG tablet Commonly known as: PROTONIX Take 1 tablet (40 mg total) by mouth 2 (two) times daily before a meal for 30 days, THEN 1 tablet (40 mg total) daily. Start taking on: February 08, 2021 What changed: See the new instructions.       Follow-up Information    Clarene Essex, MD. Schedule an appointment as soon as possible for a visit in 6 week(s).   Specialty: Gastroenterology Why: Follow-up on gastric ulcer Contact information: [address] [place] [address] [phone]              Allergies  Allergen Reactions  . Charentais Melon (French Melon) Hives    Honeydew melon   . Shellfish Allergy Hives  . Sulfa Antibiotics Other (See Comments)    unknown    Time coordinating discharge: Over 30 minutes   SIGNED:   Harold Hedge, D.O. [facility] Pager: [phone]  02/08/2021, 11:27 AM

## 2021-02-08 NOTE — Plan of Care (Signed)
  Problem: Education: Goal: Ability to identify signs and symptoms of gastrointestinal bleeding will improve Outcome: Completed/Met   Problem: Education: Goal: Knowledge of General Education information will improve Description: Including pain rating scale, medication(s)/side effects and non-pharmacologic comfort measures Outcome: Completed/Met   Problem: Activity: Goal: Risk for activity intolerance will decrease Outcome: Completed/Met   Problem: Nutrition: Goal: Adequate nutrition will be maintained Outcome: Completed/Met

## 2021-02-09 ENCOUNTER — Encounter (HOSPITAL_COMMUNITY): Payer: Self-pay | Admitting: Gastroenterology

## 2021-02-10 LAB — H PYLORI, IGM, IGG, IGA AB
H Pylori IgG: 7.75 Index Value — ABNORMAL HIGH (ref 0.00–0.79)
H. Pylogi, Iga Abs: 26.2 units — ABNORMAL HIGH (ref 0.0–8.9)
H. Pylogi, Igm Abs: <9 units (ref 0.0–8.9)

## 2021-02-11 LAB — METHYLMALONIC ACID, SERUM: Methylmalonic Acid, Quantitative: 82 nmol/L (ref 0–378)

## 2021-02-11 LAB — H PYLORI, IGM, IGG, IGA AB
H Pylori IgG: 8.38 Index Value — ABNORMAL HIGH (ref 0.00–0.79)
H. Pylogi, Iga Abs: 25.3 units — ABNORMAL HIGH (ref 0.0–8.9)
H. Pylogi, Igm Abs: <9 units (ref 0.0–8.9)

## 2021-02-12 ENCOUNTER — Other Ambulatory Visit: Payer: Self-pay

## 2021-02-12 ENCOUNTER — Emergency Department (HOSPITAL_COMMUNITY)
Admission: EM | Admit: 2021-02-12 | Discharge: 2021-02-12 | Disposition: A | Discharge status: Home / Self Care | Payer: BC Managed Care – PPO | Attending: Emergency Medicine | Admitting: Emergency Medicine

## 2021-02-12 ENCOUNTER — Encounter (HOSPITAL_COMMUNITY): Payer: Self-pay

## 2021-02-12 DIAGNOSIS — R112 Nausea with vomiting, unspecified: Secondary | ICD-10-CM | POA: Diagnosis present

## 2021-02-12 DIAGNOSIS — Z87891 Personal history of nicotine dependence: Secondary | ICD-10-CM | POA: Insufficient documentation

## 2021-02-12 DIAGNOSIS — J4520 Mild intermittent asthma, uncomplicated: Secondary | ICD-10-CM | POA: Diagnosis not present

## 2021-02-12 DIAGNOSIS — Z79899 Other long term (current) drug therapy: Secondary | ICD-10-CM | POA: Insufficient documentation

## 2021-02-12 DIAGNOSIS — I10 Essential (primary) hypertension: Secondary | ICD-10-CM | POA: Diagnosis not present

## 2021-02-12 LAB — CBC
HCT: 31.3 % — ABNORMAL LOW (ref 39.0–52.0)
Hemoglobin: 10.1 g/dL — ABNORMAL LOW (ref 13.0–17.0)
MCH: 29.5 pg (ref 26.0–34.0)
MCHC: 32.3 g/dL (ref 30.0–36.0)
MCV: 91.5 fL (ref 80.0–100.0)
Platelets: 537 10*3/uL — ABNORMAL HIGH (ref 150–400)
RBC: 3.42 MIL/uL — ABNORMAL LOW (ref 4.22–5.81)
RDW: 14.2 % (ref 11.5–15.5)
WBC: 9.1 10*3/uL (ref 4.0–10.5)
nRBC: 0 % (ref 0.0–0.2)

## 2021-02-12 LAB — URINALYSIS, ROUTINE W REFLEX MICROSCOPIC
Bacteria, UA: NONE SEEN
Bilirubin Urine: NEGATIVE
Glucose, UA: >=500 mg/dL — AB
Hgb urine dipstick: NEGATIVE
Ketones, ur: NEGATIVE mg/dL
Leukocytes,Ua: NEGATIVE
Nitrite: NEGATIVE
Protein, ur: NEGATIVE mg/dL
Specific Gravity, Urine: 1.024 (ref 1.005–1.030)
pH: 5 (ref 5.0–8.0)

## 2021-02-12 LAB — BASIC METABOLIC PANEL
Anion gap: 10 (ref 5–15)
BUN: 16 mg/dL (ref 6–20)
CO2: 22 mmol/L (ref 22–32)
Calcium: 9.1 mg/dL (ref 8.9–10.3)
Chloride: 107 mmol/L (ref 98–111)
Creatinine, Ser: 1.12 mg/dL (ref 0.61–1.24)
GFR, Estimated: >60 mL/min (ref >60)
Glucose, Bld: 147 mg/dL — ABNORMAL HIGH (ref 70–99)
Potassium: 4.5 mmol/L (ref 3.5–5.1)
Sodium: 139 mmol/L (ref 135–145)

## 2021-02-12 LAB — CBG MONITORING, ED: Glucose-Capillary: 145 mg/dL — ABNORMAL HIGH (ref 70–99)

## 2021-02-12 NOTE — ED Triage Notes (Signed)
Pt BIB EMS from home. Pt reports sudden onset of vomiting, dizziness, and weakness. Pt was recently discharged from hospital for GI bleed. Pt denies rectal bleeding and hematemesis. Pt states that he is pretty sure he took a double dose of Lisinopril today.   90/50 HR 90 CBG 164 99%

## 2021-02-12 NOTE — Discharge Instructions (Signed)
You are seen her today for your episode of nausea and vomiting.  Your physical exam, vital signs, and blood work were reassuring.    You may did follow-up with your primary care doctor and GI specialist as previously scheduled.  Please be cognizant of the timing of your medications in order to avoid double dosing down in the future.  Return to the ER if you develop any new severe symptoms.

## 2021-02-12 NOTE — ED Notes (Signed)
Gold top tube sent to lab. 

## 2021-02-12 NOTE — ED Provider Notes (Signed)
[facility] DEPT Provider Note   CSN: [id number] Arrival date & time: 02/12/21  1343     History Chief Complaint  Patient presents with  . Weakness    Derek Lawson is a 35 y.o. male who presents with concern for single episode of nausea and NBNB emesis this afternoon.  Patient was recently admitted upper GI bleed likely secondary to NSAID overusage.  He has been following with gastroenterology since his discharge on 4/24.  He states that he woke up at 7:00 this morning and he took his medications including Farxiga, lisinopril, and pantoprazole.  He states that he laid back down to take a nap when he woke up at noon, he was still groggy and he mistakenly took another dose of all 3 of his medications.  Shortly afterwards he vomited them back up.  He denies any abdominal pain, chest pain, denies any hematemesis or coffee-ground emesis.  Denies any recent diarrhea, melena, or hematochezia.  He states he has been feeling well since his discharge and has been compliant with all his medications.  At the time my exam, the patient is feeling much improved.  States he was concerned for his safety given he actually took another dose of all of this medications.  I personally reviewed this patient's medical records.  He has a history of upper GI bleed secondary to NSAID use, on Protonix, recently diagnosed with type 2 diabetes on Farxiga, and hypertension is on lisinopril.   HPI     Past Medical History:  Diagnosis Date  . Asthma   . Hypertension     Patient Active Problem List   Diagnosis Date Noted  . Obesity, diabetes, and hypertension syndrome ([place]) 02/08/2021  . Syncope 02/05/2021  . GIB (gastrointestinal bleeding) 02/05/2021  . Dizziness 02/03/2021  . Acute blood loss anemia 02/03/2021  . Left ankle pain 02/03/2021  . Allergic rhinitis   . Acute upper GI bleed 02/02/2021  . Essential hypertension 04/15/2016  . Asthma, mild intermittent  04/15/2016  . Obesity 04/15/2016  . BMI 33.0-33.9,adult 04/15/2016  . Tobacco dependence 04/15/2016    Past Surgical History:  Procedure Laterality Date  . ESOPHAGOGASTRODUODENOSCOPY N/A 02/05/2021   Procedure: ESOPHAGOGASTRODUODENOSCOPY (EGD);  Surgeon: Clarene Essex, MD;  Location: [facility];  Service: Endoscopy;  Laterality: N/A;  . ESOPHAGOGASTRODUODENOSCOPY (EGD) WITH PROPOFOL N/A 02/03/2021   Procedure: ESOPHAGOGASTRODUODENOSCOPY (EGD) WITH PROPOFOL;  Surgeon: Clarene Essex, MD;  Location: [facility];  Service: Endoscopy;  Laterality: N/A;  . HOT HEMOSTASIS N/A 02/05/2021   Procedure: HOT HEMOSTASIS (ARGON PLASMA COAGULATION/BICAP);  Surgeon: Clarene Essex, MD;  Location: [facility];  Service: Endoscopy;  Laterality: N/A;  . SCLEROTHERAPY  02/05/2021   Procedure: SCLEROTHERAPY;  Surgeon: Clarene Essex, MD;  Location: [facility];  Service: Endoscopy;;       Family History  Problem Relation Age of Onset  . Diabetes Father     Social History   Tobacco Use  . Smoking status: Former Smoker    Packs/day: 0.50    Years: 0.50    Pack years: 0.25    Types: Cigarettes    Quit date: 08/10/2013    Years since quitting: 7.5  . Smokeless tobacco: Never Used  Substance Use Topics  . Alcohol use: Not Currently    Comment: socially  . Drug use: No    Home Medications Prior to Admission medications   Medication Sig Start Date End Date Taking? Authorizing Provider  albuterol (VENTOLIN HFA) 108 (90 Base) MCG/ACT inhaler Inhale 1-2  puffs into the lungs every 6 (six) hours as needed for wheezing or shortness of breath. 10/01/20   Vanessa Kick, MD  blood glucose meter kit and supplies Dispense based on patient and insurance preference. Use up to four times daily as directed. (FOR ICD-10 E10.9, E11.9). 02/08/21   Harold Hedge, MD  cetirizine (ZYRTEC) 10 MG chewable tablet Chew 10 mg by mouth daily.    [provider]  dapagliflozin propanediol (FARXIGA) 5 MG TABS tablet Take 1  tablet (5 mg total) by mouth daily before breakfast. 02/08/21   Harold Hedge, MD  lisinopril (ZESTRIL) 10 MG tablet Take 1 tablet (10 mg total) by mouth daily. 02/04/21   Harold Hedge, MD  Multiple Vitamin (MULTIVITAMIN WITH MINERALS) TABS tablet Take 1 tablet by mouth daily.    [provider]  pantoprazole (PROTONIX) 40 MG tablet Take 1 tablet (40 mg total) by mouth 2 (two) times daily before a meal for 30 days, THEN 1 tablet (40 mg total) daily. 02/08/21 04/09/21  Harold Hedge, MD  ALBUTEROL IN Inhale 2 puffs into the lungs 4 (four) times daily as needed.   08/10/14  [provider]  atorvastatin (LIPITOR) 20 MG tablet Take 1 tablet (20 mg total) by mouth daily. 04/16/16 02/22/20  Dorena Dew, FNP  montelukast (SINGULAIR) 10 MG tablet Take 1 tablet (10 mg total) by mouth at bedtime. 04/15/16 02/22/20  Dorena Dew, FNP    Allergies    Charentais melon (french melon), Shellfish allergy, and Sulfa antibiotics  Review of Systems   Review of Systems  Constitutional: Negative.   HENT: Negative.   Eyes: Negative.   Respiratory: Negative.   Cardiovascular: Negative.   Gastrointestinal: Positive for nausea and vomiting. Negative for abdominal pain, blood in stool, constipation and diarrhea.  Genitourinary: Negative.   Musculoskeletal: Negative.   Skin: Negative.   Neurological: Positive for light-headedness. Negative for dizziness, tremors, syncope, facial asymmetry, weakness and headaches.  Hematological: Negative.   Psychiatric/Behavioral: Negative.     Physical Exam Updated Vital Signs BP 126/85   Pulse 76   Temp 97.7 F (36.5 C) (Oral)   Resp 19   Ht $R'5\' 10"'ZX$  (1.778 m)   Wt 103.4 kg   SpO2 100%   BMI 32.71 kg/m   Physical Exam Vitals and nursing note reviewed.  Constitutional:      Appearance: He is not ill-appearing or toxic-appearing.  HENT:     Head: Normocephalic and atraumatic.     Nose: Nose normal.     Mouth/Throat:     Mouth: Mucous  membranes are moist.     Pharynx: Oropharynx is clear. Uvula midline. No oropharyngeal exudate, posterior oropharyngeal erythema or uvula swelling.     Tonsils: No tonsillar exudate.  Eyes:     General: Lids are normal. Vision grossly intact.        Right eye: No discharge.        Left eye: No discharge.     Extraocular Movements: Extraocular movements intact.     Conjunctiva/sclera: Conjunctivae normal.     Pupils: Pupils are equal, round, and reactive to light.  Neck:     Trachea: Trachea and phonation normal.  Cardiovascular:     Rate and Rhythm: Normal rate and regular rhythm.     Pulses: Normal pulses.     Heart sounds: Normal heart sounds. No murmur heard.   Pulmonary:     Effort: Pulmonary effort is normal. No tachypnea, bradypnea, accessory muscle usage,  prolonged expiration or respiratory distress.     Breath sounds: Normal breath sounds. No wheezing or rales.  Chest:     Chest wall: No mass, lacerations, deformity, swelling, tenderness, crepitus or edema.  Abdominal:     General: Bowel sounds are normal. There is no distension.     Palpations: Abdomen is soft.     Tenderness: There is no abdominal tenderness. There is no right CVA tenderness, left CVA tenderness, guarding or rebound.  Musculoskeletal:        General: No deformity.     Cervical back: Normal range of motion and neck supple. No edema, rigidity, tenderness or crepitus. No pain with movement or spinous process tenderness.     Right lower leg: No edema.     Left lower leg: No edema.  Lymphadenopathy:     Cervical: No cervical adenopathy.  Skin:    General: Skin is warm and dry.     Capillary Refill: Capillary refill takes less than 2 seconds.  Neurological:     General: No focal deficit present.     Mental Status: He is alert and oriented to person, place, and time. Mental status is at baseline.     Sensory: Sensation is intact.     Motor: Motor function is intact.     Gait: Gait is intact.   Psychiatric:        Mood and Affect: Mood normal.     ED Results / Procedures / Treatments   Labs (all labs ordered are listed, but only abnormal results are displayed) Labs Reviewed  BASIC METABOLIC PANEL - Abnormal; Notable for the following components:      Result Value   Glucose, Bld 147 (*)    All other components within normal limits  CBC - Abnormal; Notable for the following components:   RBC 3.42 (*)    Hemoglobin 10.1 (*)    HCT 31.3 (*)    Platelets 537 (*)    All other components within normal limits  URINALYSIS, ROUTINE W REFLEX MICROSCOPIC - Abnormal; Notable for the following components:   Glucose, UA >=500 (*)    All other components within normal limits  CBG MONITORING, ED - Abnormal; Notable for the following components:   Glucose-Capillary 145 (*)    All other components within normal limits    EKG EKG: normal sinus rhythm, no STEMI.  Radiology No results found.  Procedures Procedures   Medications Ordered in ED Medications - No data to display  ED Course  I have reviewed the triage vital signs and the nursing notes.  Pertinent labs & imaging results that were available during my care of the patient were reviewed by me and considered in my medical decision making (see chart for details).    MDM Rules/Calculators/A&P                          35 year old male with history of GI bleed who presents with concern for episode of NBNB emesis this afternoon accompanied by nausea and lightheadedness after he took a second dose of his Lisinopril, Farxiga, and Pantoprazole.  Immediately vomited the medications back up.  Vital signs are normal on intake.  Cardiopulmonary exam is normal, abdominal exam is benign, patient is neurovascularly intact and there is no lower extremity edema.  Patient is ambulatory in the emergency department.  Physical operatory studies obtained in triage.  CBC without leukocytosis, hemoglobin 10.1, improved from prior, of 8.6.  BMP  unremarkable,  urine with large amount of glucose, on Farxiga.  EKG with sinus rhythm, no STEMI. Orthostatic vital signs normal, no sign of orthostasis.  No further work-up warranted in the time given reassuring physical exam, vital signs, and laboratory studies.  Blood pressure remains well, 126/85.  Patient is well-appearing and states he is ready to go home.  Low concern for toxicity of same doses of medications as they were all low-dose and he vomited them back up immediately after taking them.  Recommend close follow-up with primary care doctor and GI as previously scheduled.  Nakhi voiced understanding of his medical evaluation and treatment plan.  Each of his questions was answered to his expressed satisfaction.  Return precautions were given.  Patient is well-appearing, stable, and appropriate for discharge at this time.  This chart was dictated using voice recognition software, Dragon. Despite the best efforts of this provider to proofread and correct errors, errors may still occur which can change documentation meaning.  Final Clinical Impression(s) / ED Diagnoses Final diagnoses:  Non-intractable vomiting with nausea, unspecified vomiting type    Rx / DC Orders ED Discharge Orders    None       Aura Dials 02/12/21 1650    Daleen Bo, MD 02/17/21 1015

## 2021-02-12 NOTE — ED Provider Notes (Signed)
MSE was initiated and I personally evaluated the patient and placed orders (if any) at  2:03 PM on February 12, 2021.  The patient appears stable so that the remainder of the MSE may be completed by another provider.   Chief Complaint: took an extra lisinopril this afternoon   HPI:  pt called ems because he felt dizzy and weak after taking an extra lisinopril this afternoon on accident.    ROS: dizzy, sleepy, weak    Physical Exam:  Gen:                Awake, no distress, sleepy HEENT:          Atraumatic  Resp:              Normal effort  Cardiac:          Normal rate  Abd:                Nondistended, nontender  MSK:              Moves extremities without difficulty  Neuro:            Speech clear,EOMS intact     Initiation of care has begun. The patient has been counseled on the process, plan, and necessity for staying for the completion/evaluation, and the remainder of the medical screening examination    Farrel Gordon, PA-C 02/12/21 1404    Virgina Norfolk, DO 02/12/21 1553

## 2021-07-11 ENCOUNTER — Encounter (HOSPITAL_COMMUNITY): Payer: Self-pay | Admitting: Emergency Medicine

## 2021-07-11 ENCOUNTER — Other Ambulatory Visit: Payer: Self-pay

## 2021-07-11 ENCOUNTER — Emergency Department (HOSPITAL_COMMUNITY)
Admission: EM | Admit: 2021-07-11 | Discharge: 2021-07-12 | Disposition: A | Discharge status: Home / Self Care | Payer: BC Managed Care – PPO | Attending: Emergency Medicine | Admitting: Emergency Medicine

## 2021-07-11 DIAGNOSIS — I10 Essential (primary) hypertension: Secondary | ICD-10-CM | POA: Diagnosis not present

## 2021-07-11 DIAGNOSIS — X509XXA Other and unspecified overexertion or strenuous movements or postures, initial encounter: Secondary | ICD-10-CM | POA: Diagnosis not present

## 2021-07-11 DIAGNOSIS — Y990 Civilian activity done for income or pay: Secondary | ICD-10-CM | POA: Diagnosis not present

## 2021-07-11 DIAGNOSIS — Z79899 Other long term (current) drug therapy: Secondary | ICD-10-CM | POA: Insufficient documentation

## 2021-07-11 DIAGNOSIS — J4520 Mild intermittent asthma, uncomplicated: Secondary | ICD-10-CM | POA: Diagnosis not present

## 2021-07-11 DIAGNOSIS — Y9289 Other specified places as the place of occurrence of the external cause: Secondary | ICD-10-CM | POA: Diagnosis not present

## 2021-07-11 DIAGNOSIS — Z87891 Personal history of nicotine dependence: Secondary | ICD-10-CM | POA: Diagnosis not present

## 2021-07-11 DIAGNOSIS — M546 Pain in thoracic spine: Secondary | ICD-10-CM

## 2021-07-11 MED ORDER — METHOCARBAMOL 500 MG PO TABS
500.0000 mg | ORAL_TABLET | Freq: Once | ORAL | Status: AC
  Administered 2021-07-12: 500 mg via ORAL
  Filled 2021-07-11: qty 1

## 2021-07-11 MED ORDER — ACETAMINOPHEN 500 MG PO TABS
1000.0000 mg | ORAL_TABLET | Freq: Once | ORAL | Status: AC
  Administered 2021-07-12: 1000 mg via ORAL
  Filled 2021-07-11: qty 2

## 2021-07-11 NOTE — ED Provider Notes (Signed)
[facility] DEPT Provider Note   CSN: [id number] Arrival date & time: 07/11/21  2238     History Chief Complaint  Patient presents with   Back Pain    Derek Lawson is a 35 y.o. male.  The history is provided by the patient and medical records.  Back Pain  35 y.o. M with hx of asthma, HTN, obesity, hx of ulcers and GI bleeding, presenting to the ED for left sided back pain.  States he did some heavy lifting at work on Wednesday 07/08/21 and feels like he pulled a muscle.  He denies any new injury, trauma, or falls.  He states he has had continued left sided back pain and not feels a "knot".  He denies any nausea, vomiting, difficulty urinating, hematuria, or dysuria.  He has been taking tylenol at home without relief.    Past Medical History:  Diagnosis Date   Asthma    Hypertension     Patient Active Problem List   Diagnosis Date Noted   Obesity, diabetes, and hypertension syndrome ([place]) 02/08/2021   Syncope 02/05/2021   GIB (gastrointestinal bleeding) 02/05/2021   Dizziness 02/03/2021   Acute blood loss anemia 02/03/2021   Left ankle pain 02/03/2021   Allergic rhinitis    Acute upper GI bleed 02/02/2021   Essential hypertension 04/15/2016   Asthma, mild intermittent 04/15/2016   Obesity 04/15/2016   BMI 33.0-33.9,adult 04/15/2016   Tobacco dependence 04/15/2016    Past Surgical History:  Procedure Laterality Date   ESOPHAGOGASTRODUODENOSCOPY N/A 02/05/2021   Procedure: ESOPHAGOGASTRODUODENOSCOPY (EGD);  Surgeon: Clarene Essex, MD;  Location: [facility];  Service: Endoscopy;  Laterality: N/A;   ESOPHAGOGASTRODUODENOSCOPY (EGD) WITH PROPOFOL N/A 02/03/2021   Procedure: ESOPHAGOGASTRODUODENOSCOPY (EGD) WITH PROPOFOL;  Surgeon: Clarene Essex, MD;  Location: [facility];  Service: Endoscopy;  Laterality: N/A;   HOT HEMOSTASIS N/A 02/05/2021   Procedure: HOT HEMOSTASIS (ARGON PLASMA COAGULATION/BICAP);  Surgeon: Clarene Essex, MD;  Location:  [facility];  Service: Endoscopy;  Laterality: N/A;   SCLEROTHERAPY  02/05/2021   Procedure: Clide Deutscher;  Surgeon: Clarene Essex, MD;  Location: [facility];  Service: Endoscopy;;       Family History  Problem Relation Age of Onset   Diabetes Father     Social History   Tobacco Use   Smoking status: Former    Packs/day: 0.50    Years: 0.50    Pack years: 0.25    Types: Cigarettes    Quit date: 08/10/2013    Years since quitting: 7.9   Smokeless tobacco: Never  Substance Use Topics   Alcohol use: Not Currently    Comment: socially   Drug use: No    Home Medications Prior to Admission medications   Medication Sig Start Date End Date Taking? Authorizing Provider  methocarbamol (ROBAXIN) 500 MG tablet Take 1 tablet (500 mg total) by mouth 2 (two) times daily. 07/12/21  Yes Larene Pickett, PA-C  albuterol (VENTOLIN HFA) 108 (90 Base) MCG/ACT inhaler Inhale 1-2 puffs into the lungs every 6 (six) hours as needed for wheezing or shortness of breath. 10/01/20   Vanessa Kick, MD  blood glucose meter kit and supplies Dispense based on patient and insurance preference. Use up to four times daily as directed. (FOR ICD-10 E10.9, E11.9). 02/08/21   Harold Hedge, MD  cetirizine (ZYRTEC) 10 MG chewable tablet Chew 10 mg by mouth daily.    [provider]  dapagliflozin propanediol (FARXIGA) 5 MG TABS tablet Take 1 tablet (5  mg total) by mouth daily before breakfast. 02/08/21   Harold Hedge, MD  lisinopril (ZESTRIL) 10 MG tablet Take 1 tablet (10 mg total) by mouth daily. 02/04/21   Harold Hedge, MD  Multiple Vitamin (MULTIVITAMIN WITH MINERALS) TABS tablet Take 1 tablet by mouth daily.    [provider]  pantoprazole (PROTONIX) 40 MG tablet Take 1 tablet (40 mg total) by mouth 2 (two) times daily before a meal for 30 days, THEN 1 tablet (40 mg total) daily. 02/08/21 04/09/21  Harold Hedge, MD  ALBUTEROL IN Inhale 2 puffs into the lungs 4 (four) times daily as needed.    08/10/14  [provider]  atorvastatin (LIPITOR) 20 MG tablet Take 1 tablet (20 mg total) by mouth daily. 04/16/16 02/22/20  Dorena Dew, FNP  montelukast (SINGULAIR) 10 MG tablet Take 1 tablet (10 mg total) by mouth at bedtime. 04/15/16 02/22/20  Dorena Dew, FNP    Allergies    Charentais melon (french melon), Shellfish allergy, and Sulfa antibiotics  Review of Systems   Review of Systems  Musculoskeletal:  Positive for back pain.  All other systems reviewed and are negative.  Physical Exam Updated Vital Signs BP (!) 140/95   Pulse 96   Temp 97.7 F (36.5 C) (Oral)   Resp 20   Ht $R'5\' 9"'Jv$  (1.753 m)   Wt 99.8 kg   SpO2 96%   BMI 32.49 kg/m   Physical Exam Vitals and nursing note reviewed.  Constitutional:      Appearance: He is well-developed.  HENT:     Head: Normocephalic and atraumatic.  Eyes:     Conjunctiva/sclera: Conjunctivae normal.     Pupils: Pupils are equal, round, and reactive to light.  Cardiovascular:     Rate and Rhythm: Normal rate and regular rhythm.     Heart sounds: Normal heart sounds.  Pulmonary:     Effort: Pulmonary effort is normal. No respiratory distress.     Breath sounds: Normal breath sounds. No rhonchi.  Abdominal:     General: Bowel sounds are normal.     Palpations: Abdomen is soft.     Comments: No CVA tenderness  Musculoskeletal:        General: Normal range of motion.     Cervical back: Normal range of motion.       Back:     Comments: Tenderness to area as depicted above, there is no palpable bulge but reproducible tenderness to the musculature  Skin:    General: Skin is warm and dry.  Neurological:     Mental Status: He is alert and oriented to person, place, and time.    ED Results / Procedures / Treatments   Labs (all labs ordered are listed, but only abnormal results are displayed) Labs Reviewed - No data to display  EKG None  Radiology No results found.  Procedures Procedures   Medications  Ordered in ED Medications  methocarbamol (ROBAXIN) tablet 500 mg (500 mg Oral Given 07/12/21 0017)  acetaminophen (TYLENOL) tablet 1,000 mg (1,000 mg Oral Given 07/12/21 0017)    ED Course  I have reviewed the triage vital signs and the nursing notes.  Pertinent labs & imaging results that were available during my care of the patient were reviewed by me and considered in my medical decision making (see chart for details).    MDM Rules/Calculators/A&P  35 year old male here with left-sided back pain after lifting a heavy object several days ago at work.  States now he feels a "knot" in his left back.  He denies any difficulty urinating, hematuria, nausea, vomiting, diarrhea, or fever.  He has no history of renal dysfunction or kidney stones.  On exam he has muscular tenderness to the left back.  There is no CVA tenderness elicited.  I do not appreciate any mass or bulge.  Suspect this is likely musculoskeletal and will treat symptomatically.  He does have history of gastric ulcers and upper GI bleeding so we will avoid steroids and NSAIDs.  Have encouraged him to follow-up closely with his primary care doctor.  He may return here for new concerns.  Final Clinical Impression(s) / ED Diagnoses Final diagnoses:  Acute left-sided thoracic back pain    Rx / DC Orders ED Discharge Orders          Ordered    methocarbamol (ROBAXIN) 500 MG tablet  2 times daily        07/12/21 0026             Larene Pickett, PA-C 07/12/21 3300    Veryl Speak, MD 07/12/21 [phone]

## 2021-07-11 NOTE — ED Triage Notes (Signed)
Patient reports left-sided back pain and states he noticed a "small knot" on the left flank area. He states he thinks he may have pulled a muscle in his back last week. Denies fevers or any other symptoms. Reports taking tylenol w/ no relief.

## 2021-07-12 MED ORDER — METHOCARBAMOL 500 MG PO TABS: 500.0000 mg | ORAL_TABLET | Freq: Two times a day (BID) | ORAL | 0 refills | Status: DC

## 2021-07-12 NOTE — Discharge Instructions (Signed)
Take the prescribed medication as directed.  You can continue tylenol with this.  Given your history of ulcers I would avoid NSAIDs (motrin, aleve, ibuprofen, etc).  Can also use heat/ice on the back. Follow-up with your primary care doctor. Return to the ED for new or worsening symptoms.

## 2021-07-15 ENCOUNTER — Ambulatory Visit
Admission: RE | Admit: 2021-07-15 | Discharge: 2021-07-15 | Disposition: A | Discharge status: Home / Self Care | Payer: Self-pay | Source: Ambulatory Visit | Attending: Family Medicine | Admitting: Family Medicine

## 2021-07-15 ENCOUNTER — Other Ambulatory Visit: Payer: Self-pay

## 2021-07-15 ENCOUNTER — Other Ambulatory Visit: Payer: Self-pay | Admitting: Family Medicine

## 2021-07-15 DIAGNOSIS — R52 Pain, unspecified: Secondary | ICD-10-CM

## 2021-12-15 ENCOUNTER — Other Ambulatory Visit: Payer: Self-pay

## 2021-12-15 ENCOUNTER — Emergency Department (HOSPITAL_COMMUNITY): Payer: BC Managed Care – PPO

## 2021-12-15 ENCOUNTER — Emergency Department (HOSPITAL_COMMUNITY)
Admission: EM | Admit: 2021-12-15 | Discharge: 2021-12-15 | Disposition: A | Discharge status: Home / Self Care | Payer: BC Managed Care – PPO | Attending: Emergency Medicine | Admitting: Emergency Medicine

## 2021-12-15 ENCOUNTER — Encounter (HOSPITAL_COMMUNITY): Payer: Self-pay | Admitting: *Deleted

## 2021-12-15 DIAGNOSIS — E1165 Type 2 diabetes mellitus with hyperglycemia: Secondary | ICD-10-CM | POA: Diagnosis not present

## 2021-12-15 DIAGNOSIS — Z7951 Long term (current) use of inhaled steroids: Secondary | ICD-10-CM | POA: Insufficient documentation

## 2021-12-15 DIAGNOSIS — Z79899 Other long term (current) drug therapy: Secondary | ICD-10-CM | POA: Insufficient documentation

## 2021-12-15 DIAGNOSIS — Z7984 Long term (current) use of oral hypoglycemic drugs: Secondary | ICD-10-CM | POA: Diagnosis not present

## 2021-12-15 DIAGNOSIS — I10 Essential (primary) hypertension: Secondary | ICD-10-CM | POA: Diagnosis not present

## 2021-12-15 DIAGNOSIS — R509 Fever, unspecified: Secondary | ICD-10-CM | POA: Diagnosis not present

## 2021-12-15 DIAGNOSIS — J4520 Mild intermittent asthma, uncomplicated: Secondary | ICD-10-CM | POA: Diagnosis not present

## 2021-12-15 DIAGNOSIS — M25561 Pain in right knee: Secondary | ICD-10-CM | POA: Diagnosis present

## 2021-12-15 DIAGNOSIS — M10061 Idiopathic gout, right knee: Secondary | ICD-10-CM | POA: Insufficient documentation

## 2021-12-15 DIAGNOSIS — M109 Gout, unspecified: Secondary | ICD-10-CM

## 2021-12-15 LAB — COMPREHENSIVE METABOLIC PANEL
ALT: 70 U/L — ABNORMAL HIGH (ref 0–44)
AST: 23 U/L (ref 15–41)
Albumin: 4.5 g/dL (ref 3.5–5.0)
Alkaline Phosphatase: 72 U/L (ref 38–126)
Anion gap: 11 (ref 5–15)
BUN: 23 mg/dL — ABNORMAL HIGH (ref 6–20)
CO2: 22 mmol/L (ref 22–32)
Calcium: 9.4 mg/dL (ref 8.9–10.3)
Chloride: 102 mmol/L (ref 98–111)
Creatinine, Ser: 0.84 mg/dL (ref 0.61–1.24)
GFR, Estimated: >60 mL/min (ref >60)
Glucose, Bld: 267 mg/dL — ABNORMAL HIGH (ref 70–99)
Potassium: 4 mmol/L (ref 3.5–5.1)
Sodium: 135 mmol/L (ref 135–145)
Total Bilirubin: 0.6 mg/dL (ref 0.3–1.2)
Total Protein: 8 g/dL (ref 6.5–8.1)

## 2021-12-15 LAB — CBC WITH DIFFERENTIAL/PLATELET
Abs Immature Granulocytes: 0.02 10*3/uL (ref 0.00–0.07)
Basophils Absolute: 0.1 10*3/uL (ref 0.0–0.1)
Basophils Relative: 1 %
Eosinophils Absolute: 0.2 10*3/uL (ref 0.0–0.5)
Eosinophils Relative: 2 %
HCT: 47 % (ref 39.0–52.0)
Hemoglobin: 16.4 g/dL (ref 13.0–17.0)
Immature Granulocytes: 0 %
Lymphocytes Relative: 37 %
Lymphs Abs: 3 10*3/uL (ref 0.7–4.0)
MCH: 29 pg (ref 26.0–34.0)
MCHC: 34.9 g/dL (ref 30.0–36.0)
MCV: 83.2 fL (ref 80.0–100.0)
Monocytes Absolute: 0.7 10*3/uL (ref 0.1–1.0)
Monocytes Relative: 9 %
Neutro Abs: 4.2 10*3/uL (ref 1.7–7.7)
Neutrophils Relative %: 51 %
Platelets: 218 10*3/uL (ref 150–400)
RBC: 5.65 MIL/uL (ref 4.22–5.81)
RDW: 12.2 % (ref 11.5–15.5)
WBC: 8.2 10*3/uL (ref 4.0–10.5)
nRBC: 0 % (ref 0.0–0.2)

## 2021-12-15 LAB — SEDIMENTATION RATE: Sed Rate: 10 mm/hr (ref 0–16)

## 2021-12-15 LAB — CBG MONITORING, ED: Glucose-Capillary: 255 mg/dL — ABNORMAL HIGH (ref 70–99)

## 2021-12-15 LAB — C-REACTIVE PROTEIN: CRP: 1.8 mg/dL — ABNORMAL HIGH (ref <1.0)

## 2021-12-15 MED ORDER — LIDOCAINE-EPINEPHRINE (PF) 2 %-1:200000 IJ SOLN
10.0000 mL | Freq: Once | INTRAMUSCULAR | Status: DC
  Filled 2021-12-15: qty 20

## 2021-12-15 MED ORDER — COLCHICINE 0.6 MG PO TABS
0.6000 mg | ORAL_TABLET | Freq: Once | ORAL | Status: AC
  Administered 2021-12-15: 0.6 mg via ORAL
  Filled 2021-12-15: qty 1

## 2021-12-15 MED ORDER — OXYCODONE HCL 5 MG PO TABS
10.0000 mg | ORAL_TABLET | Freq: Once | ORAL | Status: AC
  Administered 2021-12-15: 10 mg via ORAL
  Filled 2021-12-15: qty 2

## 2021-12-15 MED ORDER — PREDNISONE 10 MG PO TABS: 20.0000 mg | ORAL_TABLET | Freq: Every day | ORAL | 0 refills | Status: DC

## 2021-12-15 NOTE — Discharge Instructions (Addendum)
Suspect you have gout.  Take prednisone 40 mg for the next 5 days.  Follow-up with your primary in the next few days for recheck.  Return to the ED if things change or worsen.

## 2021-12-15 NOTE — ED Triage Notes (Signed)
Pt states he woke up on Sunday and had pain in the left knee. Denies specific injury. Reports swelling. Has been taking tylenol without relief.
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
## 2022-04-09 ENCOUNTER — Emergency Department (HOSPITAL_COMMUNITY)
Admission: EM | Admit: 2022-04-09 | Discharge: 2022-04-09 | Disposition: A | Discharge status: Home / Self Care | Payer: BC Managed Care – PPO | Attending: Emergency Medicine | Admitting: Emergency Medicine

## 2022-04-09 ENCOUNTER — Other Ambulatory Visit: Payer: Self-pay

## 2022-04-09 ENCOUNTER — Emergency Department (HOSPITAL_COMMUNITY): Payer: BC Managed Care – PPO

## 2022-04-09 ENCOUNTER — Encounter (HOSPITAL_COMMUNITY): Payer: Self-pay | Admitting: Emergency Medicine

## 2022-04-09 DIAGNOSIS — I10 Essential (primary) hypertension: Secondary | ICD-10-CM | POA: Diagnosis not present

## 2022-04-09 DIAGNOSIS — R739 Hyperglycemia, unspecified: Secondary | ICD-10-CM | POA: Diagnosis not present

## 2022-04-09 DIAGNOSIS — Z79899 Other long term (current) drug therapy: Secondary | ICD-10-CM | POA: Insufficient documentation

## 2022-04-09 DIAGNOSIS — I160 Hypertensive urgency: Secondary | ICD-10-CM | POA: Insufficient documentation

## 2022-04-09 DIAGNOSIS — R519 Headache, unspecified: Secondary | ICD-10-CM | POA: Diagnosis present

## 2022-04-09 LAB — BASIC METABOLIC PANEL
Anion gap: 9 (ref 5–15)
BUN: 16 mg/dL (ref 6–20)
CO2: 26 mmol/L (ref 22–32)
Calcium: 10.1 mg/dL (ref 8.9–10.3)
Chloride: 103 mmol/L (ref 98–111)
Creatinine, Ser: 1.2 mg/dL (ref 0.61–1.24)
GFR, Estimated: >60 mL/min (ref >60)
Glucose, Bld: 237 mg/dL — ABNORMAL HIGH (ref 70–99)
Potassium: 4.2 mmol/L (ref 3.5–5.1)
Sodium: 138 mmol/L (ref 135–145)

## 2022-04-09 LAB — CBC WITH DIFFERENTIAL/PLATELET
Abs Immature Granulocytes: 0.01 10*3/uL (ref 0.00–0.07)
Basophils Absolute: 0 10*3/uL (ref 0.0–0.1)
Basophils Relative: 1 %
Eosinophils Absolute: 0.2 10*3/uL (ref 0.0–0.5)
Eosinophils Relative: 3 %
HCT: 48.4 % (ref 39.0–52.0)
Hemoglobin: 16.3 g/dL (ref 13.0–17.0)
Immature Granulocytes: 0 %
Lymphocytes Relative: 44 %
Lymphs Abs: 2.8 10*3/uL (ref 0.7–4.0)
MCH: 28.8 pg (ref 26.0–34.0)
MCHC: 33.7 g/dL (ref 30.0–36.0)
MCV: 85.5 fL (ref 80.0–100.0)
Monocytes Absolute: 0.6 10*3/uL (ref 0.1–1.0)
Monocytes Relative: 9 %
Neutro Abs: 2.7 10*3/uL (ref 1.7–7.7)
Neutrophils Relative %: 43 %
Platelets: 235 10*3/uL (ref 150–400)
RBC: 5.66 MIL/uL (ref 4.22–5.81)
RDW: 12.7 % (ref 11.5–15.5)
WBC: 6.2 10*3/uL (ref 4.0–10.5)
nRBC: 0 % (ref 0.0–0.2)

## 2022-04-09 LAB — URINALYSIS, ROUTINE W REFLEX MICROSCOPIC
Bacteria, UA: NONE SEEN
Bilirubin Urine: NEGATIVE
Glucose, UA: >=500 mg/dL — AB
Hgb urine dipstick: NEGATIVE
Ketones, ur: NEGATIVE mg/dL
Leukocytes,Ua: NEGATIVE
Nitrite: NEGATIVE
Protein, ur: NEGATIVE mg/dL
Specific Gravity, Urine: 1.022 (ref 1.005–1.030)
pH: 8 (ref 5.0–8.0)

## 2022-05-29 ENCOUNTER — Encounter (HOSPITAL_COMMUNITY): Payer: Self-pay | Admitting: Emergency Medicine

## 2022-05-29 ENCOUNTER — Emergency Department (HOSPITAL_COMMUNITY)
Admission: EM | Admit: 2022-05-29 | Discharge: 2022-05-29 | Disposition: A | Discharge status: Home / Self Care | Payer: BC Managed Care – PPO | Attending: Emergency Medicine | Admitting: Emergency Medicine

## 2022-05-29 ENCOUNTER — Emergency Department (HOSPITAL_COMMUNITY): Payer: BC Managed Care – PPO

## 2022-05-29 DIAGNOSIS — S0990XA Unspecified injury of head, initial encounter: Secondary | ICD-10-CM | POA: Diagnosis present

## 2022-05-29 DIAGNOSIS — F17200 Nicotine dependence, unspecified, uncomplicated: Secondary | ICD-10-CM | POA: Insufficient documentation

## 2022-05-29 DIAGNOSIS — M25561 Pain in right knee: Secondary | ICD-10-CM | POA: Diagnosis not present

## 2022-05-29 DIAGNOSIS — Z79899 Other long term (current) drug therapy: Secondary | ICD-10-CM | POA: Insufficient documentation

## 2022-05-29 DIAGNOSIS — Y92410 Unspecified street and highway as the place of occurrence of the external cause: Secondary | ICD-10-CM | POA: Insufficient documentation

## 2022-05-29 DIAGNOSIS — M25562 Pain in left knee: Secondary | ICD-10-CM | POA: Diagnosis not present

## 2022-05-29 DIAGNOSIS — J45909 Unspecified asthma, uncomplicated: Secondary | ICD-10-CM | POA: Diagnosis not present

## 2022-05-29 DIAGNOSIS — S060X0A Concussion without loss of consciousness, initial encounter: Secondary | ICD-10-CM | POA: Insufficient documentation

## 2022-05-29 MED ORDER — METHOCARBAMOL 500 MG PO TABS: 500.0000 mg | ORAL_TABLET | Freq: Two times a day (BID) | ORAL | 0 refills | Status: DC
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
## 2022-06-23 ENCOUNTER — Ambulatory Visit (HOSPITAL_COMMUNITY)
Admission: EM | Admit: 2022-06-23 | Discharge: 2022-06-23 | Disposition: A | Discharge status: Home / Self Care | Payer: BC Managed Care – PPO | Attending: Internal Medicine | Admitting: Internal Medicine

## 2022-06-23 ENCOUNTER — Ambulatory Visit (INDEPENDENT_AMBULATORY_CARE_PROVIDER_SITE_OTHER): Payer: BC Managed Care – PPO

## 2022-06-23 ENCOUNTER — Encounter (HOSPITAL_COMMUNITY): Payer: Self-pay | Admitting: Emergency Medicine

## 2022-06-23 DIAGNOSIS — M545 Low back pain, unspecified: Secondary | ICD-10-CM

## 2022-06-23 DIAGNOSIS — M5442 Lumbago with sciatica, left side: Secondary | ICD-10-CM | POA: Diagnosis not present

## 2022-06-23 MED ORDER — DICLOFENAC SODIUM 1 % EX GEL: 4.0000 g | Freq: Four times a day (QID) | CUTANEOUS | 2 refills | Status: AC

## 2022-06-23 MED ORDER — BACLOFEN 10 MG PO TABS
10.0000 mg | ORAL_TABLET | Freq: Three times a day (TID) | ORAL | 0 refills | Status: AC
Start: 2022-06-23 — End: 2022-06-30

## 2022-06-23 MED ORDER — METHYLPREDNISOLONE 4 MG PO TABS: ORAL_TABLET | ORAL | 0 refills | Status: AC

## 2022-06-23 MED ORDER — ACETAMINOPHEN 500 MG PO TABS: 1000.0000 mg | ORAL_TABLET | Freq: Three times a day (TID) | ORAL | 0 refills | Status: AC

## 2022-06-23 MED ORDER — PANTOPRAZOLE SODIUM 40 MG PO TBEC
40.0000 mg | DELAYED_RELEASE_TABLET | Freq: Every day | ORAL | 3 refills | Status: AC
Start: 2022-06-23 — End: 2023-06-18

## 2022-06-23 MED ORDER — KETOROLAC TROMETHAMINE 30 MG/ML IJ SOLN
30.0000 mg | Freq: Once | INTRAMUSCULAR | Status: AC
  Administered 2022-06-23: 30 mg via INTRAMUSCULAR

## 2022-06-23 MED ORDER — KETOROLAC TROMETHAMINE 30 MG/ML IJ SOLN
INTRAMUSCULAR | Status: AC
  Filled 2022-06-23: qty 1
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
## 2022-06-23 NOTE — Discharge Instructions (Addendum)
The x-ray of her lumbar spine was not concerning for any acute degenerative disc disease or arthritis.  Believe that the majority of your pain that you are having at this time is due to muscle tension, muscle spasm and inflammation of the tendons and muscles in your lower back.  The mainstay of therapy for musculoskeletal pain is reduction of inflammation and relaxation of tension which is causing inflammation.  Keep in mind, pain always begets more pain.  To help you stay ahead of your pain and inflammation, I have provided the following regimen for you:   During your visit today, you received an injection of ketorolac, high-dose nonsteroidal anti-inflammatory pain medication that should significantly reduce your pain for the next 6 to 8 hours.    Please begin continue Tylenol 1000 mg 3 times daily (every 8 hours), I have sent a new prescription for Tylenol to your pharmacy.   This evening, you can begin taking baclofen 10 mg.  This is a highly effective muscle relaxer and antispasmodic which should continue to provide you with relaxation of your tense muscles, allow you to sleep well and to keep your pain under control.  You can continue taking this medication 3 times daily as you need to.  If you find that this medication makes you too sleepy, you can break them in half for your daytime doses and, if needed double them for your nighttime dose.  Do not take more than 30 mg of baclofen in a 24-hour period.   Tomorrow morning, please begin taking methylprednisolone.  Please take all tablets of the daily recommended dose with your breakfast meal.  If you have had significant relation of your pain before you finish the entire prescription, please feel free to discontinue.  It is not important to finish every dose.  I have renewed your prescription for Protonix.  Please continue to take this medication daily.   During the day, please set aside time to apply ice to the affected area 4 times daily for 20  minutes each application.  This can be achieved by using a bag of frozen peas or corn, a Ziploc bag filled with ice and water, or Ziploc bag filled with half rubbing alcohol and half Dawn dish detergent, frozen into a slush.  Please be careful not to apply ice directly to your skin, always place a soft cloth between you and the ice pack.   You are welcome to use topical anti-inflammatory creams such as Voltaren gel, capsaicin or Aspercreme as recommended.  These medications are available over-the-counter, please follow manufactures instructions for use.  As a courtesy, I provided you with a prescription for diclofenac in the event that your insurance will pay for this.   Please consider discussing referral to physical therapy with your primary care provider.  Physical therapist are very good at teasing out the underlying cause of acute lower back pain and helping with prevention of future recurrences.   Please avoid attempts to stretch or strengthen the affected area until you are feeling completely pain-free.  Attempts to do so will only prolong the healing process.   If you would like to try to return return to urgent care in the next 2 to 3 days for repeat ketorolac injection, you are welcome to do so.   I also recommend that you remain out of work for the next several days, I provided you with a note to return to work in 3 days.  If you feel that you need this  time extended, please follow-up with your primary care provider or return to urgent care for reevaluation so that we can provide you with a note for another 3 days.   Thank you for visiting urgent care today.  We appreciate the opportunity to participate in your care.

## 2022-06-23 NOTE — ED Triage Notes (Signed)
Pt reports lower back pain for a few weeks. States pain was worse today and he was barely able to get out of bed. States has been taking Tylenol for pain.

## 2023-06-09 ENCOUNTER — Other Ambulatory Visit: Payer: Self-pay

## 2023-06-09 ENCOUNTER — Emergency Department (HOSPITAL_COMMUNITY)
Admission: EM | Admit: 2023-06-09 | Discharge: 2023-06-09 | Disposition: A | Discharge status: Home / Self Care | Payer: BC Managed Care – PPO | Attending: Emergency Medicine | Admitting: Emergency Medicine

## 2023-06-09 ENCOUNTER — Encounter (HOSPITAL_COMMUNITY): Payer: Self-pay

## 2023-06-09 ENCOUNTER — Emergency Department (HOSPITAL_COMMUNITY): Payer: BC Managed Care – PPO

## 2023-06-09 DIAGNOSIS — M542 Cervicalgia: Secondary | ICD-10-CM | POA: Diagnosis present

## 2023-06-09 DIAGNOSIS — Y92410 Unspecified street and highway as the place of occurrence of the external cause: Secondary | ICD-10-CM | POA: Insufficient documentation

## 2023-06-09 MED ORDER — OXYCODONE-ACETAMINOPHEN 5-325 MG PO TABS
1.0000 | ORAL_TABLET | Freq: Once | ORAL | Status: AC
  Administered 2023-06-09: 1 via ORAL
  Filled 2023-06-09: qty 1

## 2023-06-09 MED ORDER — METHOCARBAMOL 500 MG PO TABS: 500.0000 mg | ORAL_TABLET | Freq: Two times a day (BID) | ORAL | 0 refills | Status: AC

## 2023-06-09 MED ORDER — METHOCARBAMOL 500 MG PO TABS
1000.0000 mg | ORAL_TABLET | Freq: Once | ORAL | Status: AC
  Administered 2023-06-09: 1000 mg via ORAL
  Filled 2023-06-09: qty 2

## 2023-06-09 MED ORDER — DICLOFENAC EPOLAMINE 1.3 % EX PTCH
1.0000 | MEDICATED_PATCH | Freq: Two times a day (BID) | CUTANEOUS | 0 refills | Status: AC
Start: 2023-06-09 — End: ?

## 2023-06-09 MED ORDER — DICLOFENAC EPOLAMINE 1.3 % EX PTCH
1.0000 | MEDICATED_PATCH | Freq: Two times a day (BID) | CUTANEOUS | Status: DC
  Administered 2023-06-09: 1 via TRANSDERMAL
  Filled 2023-06-09: qty 1
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
## 2024-06-19 IMAGING — CT CT HEAD W/O CM
4 series · 16 of 47 positions shown, 18 images · non-contrast
Comparison: CT head 02/05/2021

CLINICAL DATA: Altered mental status



[Series 2: head wo · axial · 0.41mm/px · z∈[-176,-66]mm · 7 of 30 slices shown, 9 images]
[im 4/30  brain]
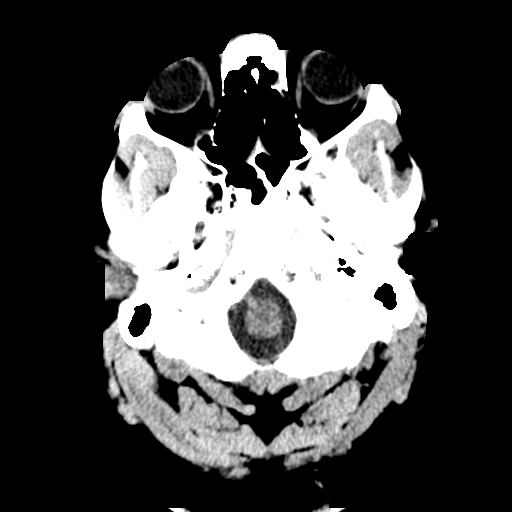
[im 4/30  bone]
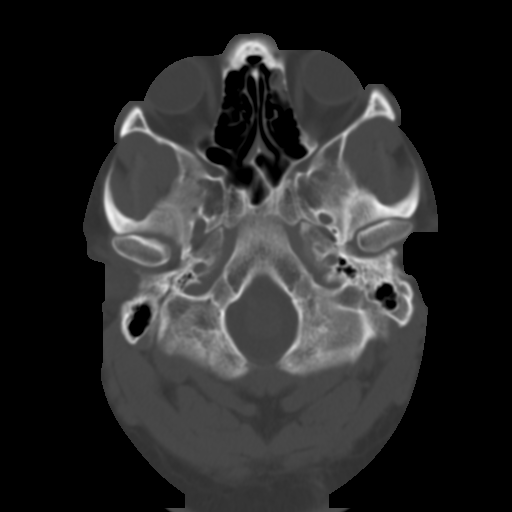
[im 8/30  brain]
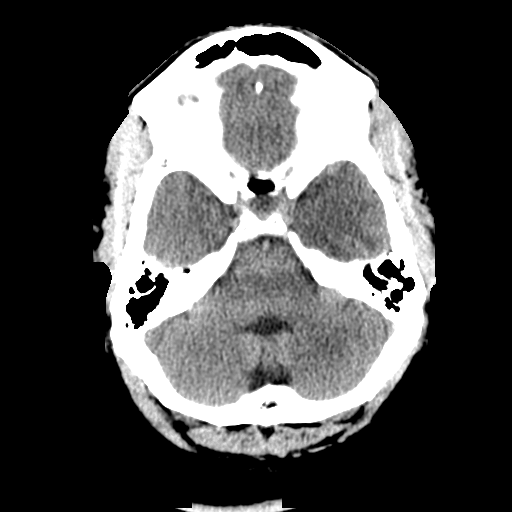
[im 11/30  brain]
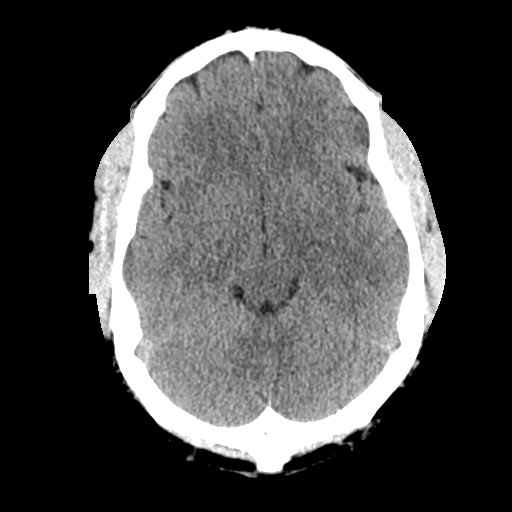
[im 15/30  brain]
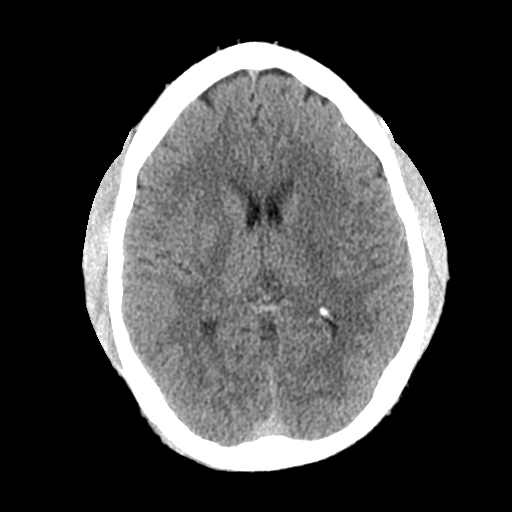
[im 19/30  brain]
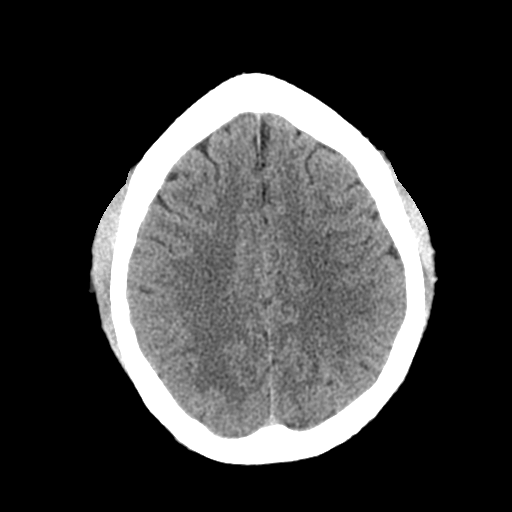
[im 19/30  bone]
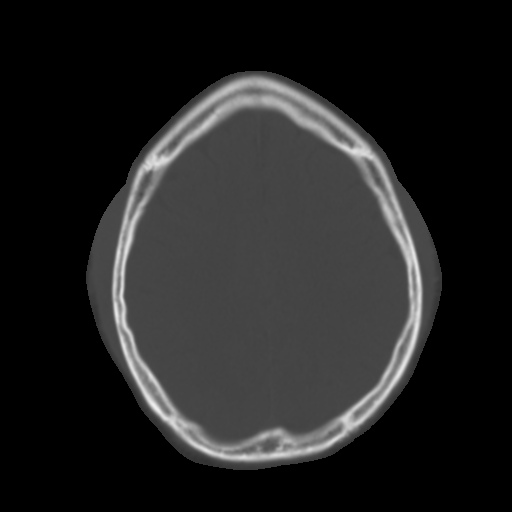
[im 22/30  brain]
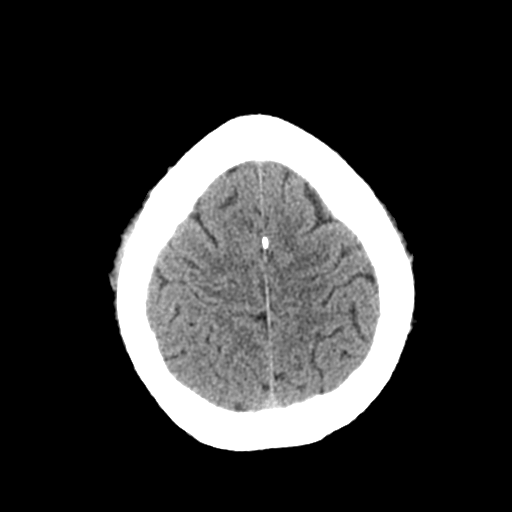
[im 26/30  brain]
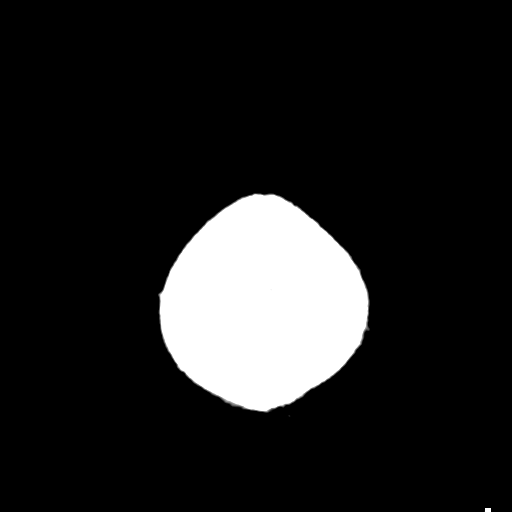

[Series 3: head bone · axial · 0.41mm/px · z∈[-177,-147]mm · 3 of 75 slices shown]
[im 8/75  bone]
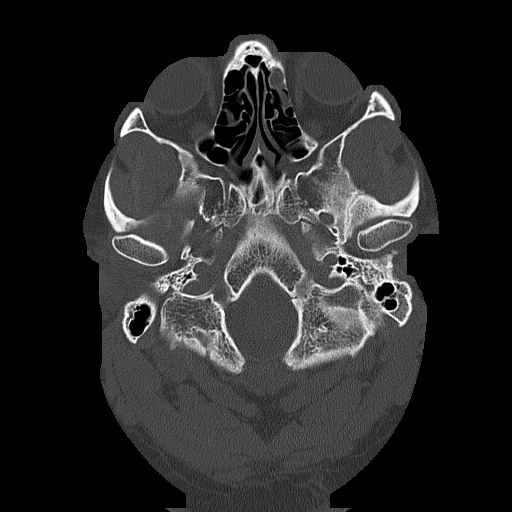
[im 15/75  bone]
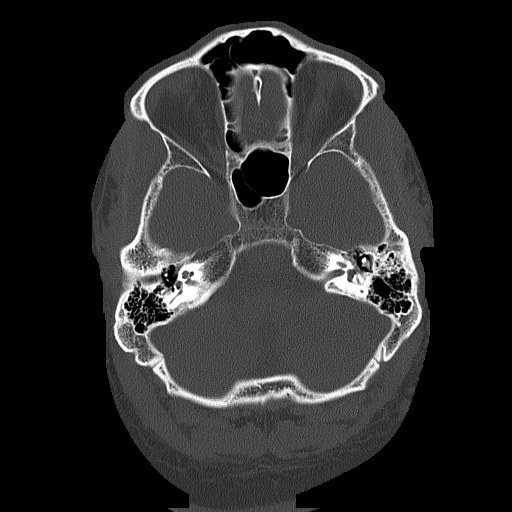
[im 23/75  bone]
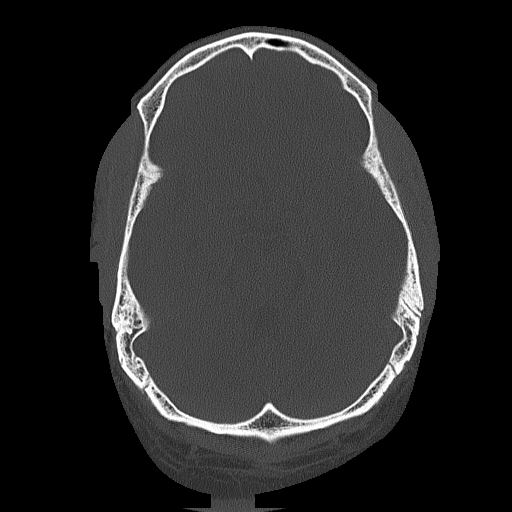

[Series 5: coronal soft tissue · coronal · 0.29mm/px · 3 of 64 slices shown]
[im 22/64  brain]
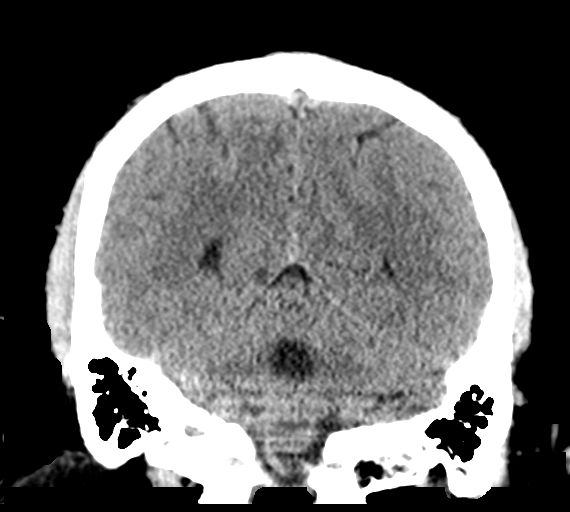
[im 29/64  brain]
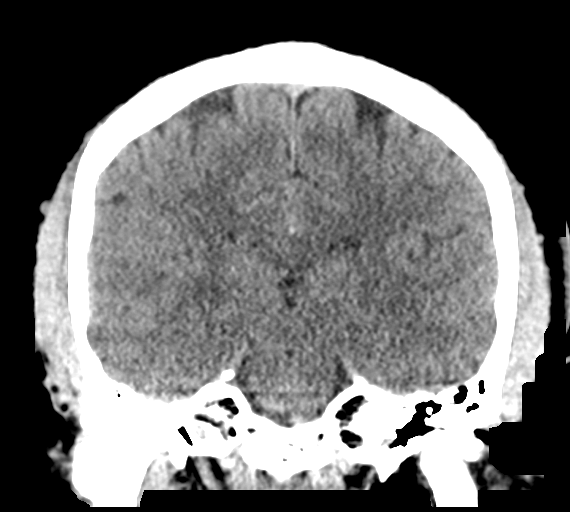
[im 36/64  brain]
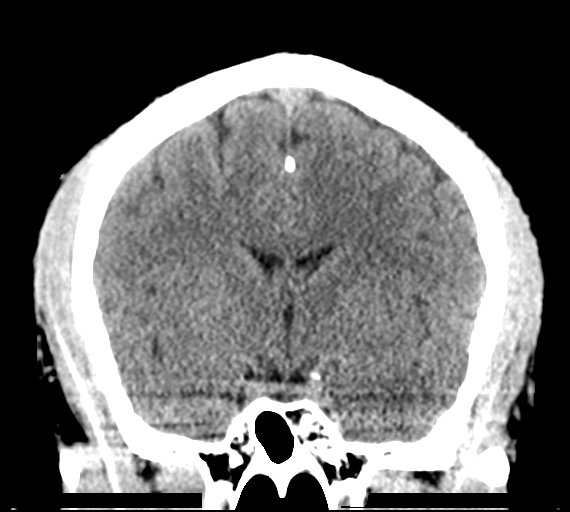

[Series 6: sagittal soft tissue · sagittal · 0.29mm/px · 3 of 56 slices shown]
[im 19/56  brain]
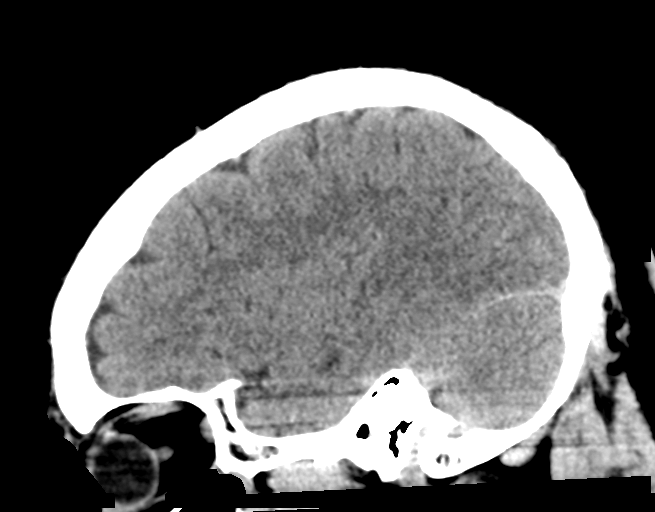
[im 28/56  brain]
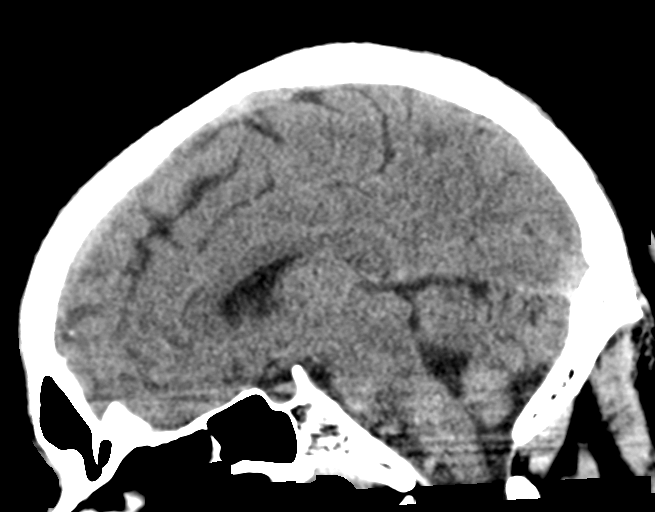
[im 37/56  brain]
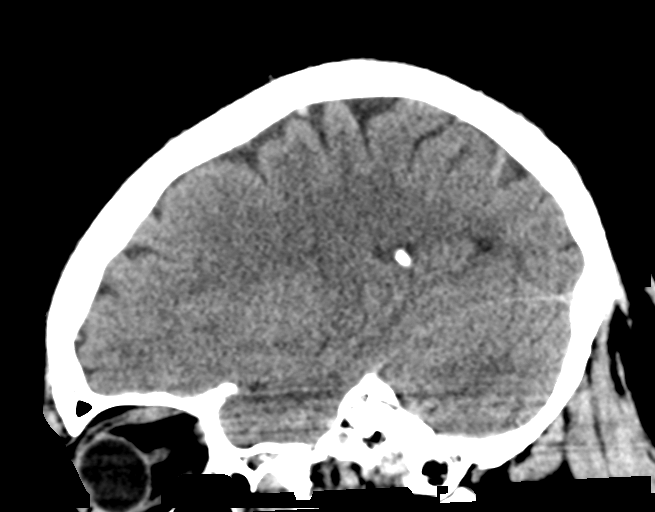

[16 of 47 positions shown; findings below may reference images not displayed]

FINDINGS: Brain: No acute intracranial hemorrhage, mass effect, or herniation.
No extra-axial fluid collections. No evidence of acute territorial
infarct. No hydrocephalus.

Vascular: No hyperdense vessel or unexpected calcification.

Skull: Normal. Negative for fracture or focal lesion.

Sinuses/Orbits: No acute finding.

Other: None.
IMPRESSION: No acute intracranial process identified.
